# Patient Record
Sex: Male | Born: 1950
Health system: Southern US, Community
[De-identification: ages and names within clinical notes are randomized; demographics above are authoritative.]

## PROBLEM LIST (undated history)

## (undated) DIAGNOSIS — R05 Cough: Secondary | ICD-10-CM

## (undated) DIAGNOSIS — R739 Hyperglycemia, unspecified: Secondary | ICD-10-CM

## (undated) DIAGNOSIS — J310 Chronic rhinitis: Secondary | ICD-10-CM

## (undated) DIAGNOSIS — J45909 Unspecified asthma, uncomplicated: Secondary | ICD-10-CM

## (undated) DIAGNOSIS — R17 Unspecified jaundice: Secondary | ICD-10-CM

## (undated) DIAGNOSIS — J301 Allergic rhinitis due to pollen: Secondary | ICD-10-CM

## (undated) DIAGNOSIS — J019 Acute sinusitis, unspecified: Secondary | ICD-10-CM

## (undated) DIAGNOSIS — E785 Hyperlipidemia, unspecified: Secondary | ICD-10-CM

## (undated) DIAGNOSIS — I10 Essential (primary) hypertension: Secondary | ICD-10-CM

## (undated) HISTORY — DX: Hyperglycemia, unspecified: R73.9

## (undated) HISTORY — DX: Allergic rhinitis due to pollen: J30.1

## (undated) HISTORY — DX: Unspecified asthma, uncomplicated: J45.909

## (undated) HISTORY — PX: TONSILLECTOMY: SUR1361

## (undated) HISTORY — DX: Essential (primary) hypertension: I10

## (undated) HISTORY — PX: FINGER GANGLION CYST EXCISION: SHX1636

## (undated) HISTORY — DX: Acute sinusitis, unspecified: J01.90

## (undated) HISTORY — DX: Chronic rhinitis: J31.0

## (undated) HISTORY — DX: Cough: R05

## (undated) HISTORY — DX: Unspecified jaundice: R17

## (undated) HISTORY — DX: Hyperlipidemia, unspecified: E78.5

---

## 2001-11-13 ENCOUNTER — Emergency Department (HOSPITAL_COMMUNITY): Admission: EM | Admit: 2001-11-13 | Discharge: 2001-11-13 | Payer: Self-pay | Admitting: Emergency Medicine

## 2002-01-12 HISTORY — PX: OTHER SURGICAL HISTORY: SHX169

## 2005-04-13 ENCOUNTER — Ambulatory Visit: Payer: Self-pay | Admitting: Internal Medicine

## 2006-01-15 ENCOUNTER — Ambulatory Visit: Payer: Self-pay | Admitting: Internal Medicine

## 2006-06-29 ENCOUNTER — Ambulatory Visit: Payer: Self-pay | Admitting: Internal Medicine

## 2006-10-22 ENCOUNTER — Encounter: Payer: Self-pay | Admitting: *Deleted

## 2006-10-22 DIAGNOSIS — R17 Unspecified jaundice: Secondary | ICD-10-CM

## 2006-10-22 DIAGNOSIS — J301 Allergic rhinitis due to pollen: Secondary | ICD-10-CM | POA: Insufficient documentation

## 2006-10-22 HISTORY — DX: Allergic rhinitis due to pollen: J30.1

## 2006-10-22 HISTORY — DX: Unspecified jaundice: R17

## 2007-12-01 ENCOUNTER — Ambulatory Visit: Payer: Self-pay | Admitting: Internal Medicine

## 2007-12-01 DIAGNOSIS — E785 Hyperlipidemia, unspecified: Secondary | ICD-10-CM | POA: Insufficient documentation

## 2007-12-01 DIAGNOSIS — J019 Acute sinusitis, unspecified: Secondary | ICD-10-CM | POA: Insufficient documentation

## 2007-12-01 HISTORY — DX: Acute sinusitis, unspecified: J01.90

## 2007-12-01 HISTORY — DX: Hyperlipidemia, unspecified: E78.5

## 2008-02-23 ENCOUNTER — Ambulatory Visit: Payer: Self-pay | Admitting: Internal Medicine

## 2008-02-23 LAB — CONVERTED CEMR LAB
AST: 38 units/L — ABNORMAL HIGH (ref 0–37)
Albumin: 4.3 g/dL (ref 3.5–5.2)
Alkaline Phosphatase: 50 units/L (ref 39–117)
Basophils Relative: 0.4 % (ref 0.0–3.0)
Bilirubin Urine: NEGATIVE
Bilirubin, Direct: 0.1 mg/dL (ref 0.0–0.3)
Calcium: 9.5 mg/dL (ref 8.4–10.5)
Chloride: 104 meq/L (ref 96–112)
Creatinine, Ser: 0.9 mg/dL (ref 0.4–1.5)
Direct LDL: 159.2 mg/dL
Eosinophils Relative: 7.2 % — ABNORMAL HIGH (ref 0.0–5.0)
Leukocytes, UA: NEGATIVE
MCV: 86.8 fL (ref 78.0–100.0)
Nitrite: NEGATIVE
PSA: 1.01 ng/mL (ref 0.10–4.00)
Platelets: 210 10*3/uL (ref 150–400)
RDW: 12.3 % (ref 11.5–14.6)
Specific Gravity, Urine: 1.015 (ref 1.000–1.03)
Total Bilirubin: 1.2 mg/dL (ref 0.3–1.2)
Total CHOL/HDL Ratio: 6
Total Protein, Urine: NEGATIVE mg/dL
Triglycerides: 88 mg/dL (ref 0–149)
Urine Glucose: NEGATIVE mg/dL
WBC: 7.5 10*3/uL (ref 4.5–10.5)

## 2008-03-06 ENCOUNTER — Ambulatory Visit: Payer: Self-pay | Admitting: Internal Medicine

## 2008-03-06 DIAGNOSIS — I10 Essential (primary) hypertension: Secondary | ICD-10-CM

## 2008-03-06 HISTORY — DX: Essential (primary) hypertension: I10

## 2008-06-29 ENCOUNTER — Telehealth: Payer: Self-pay | Admitting: Internal Medicine

## 2008-08-01 ENCOUNTER — Telehealth: Payer: Self-pay | Admitting: Internal Medicine

## 2008-10-05 ENCOUNTER — Telehealth: Payer: Self-pay | Admitting: Internal Medicine

## 2008-10-05 DIAGNOSIS — J4540 Moderate persistent asthma, uncomplicated: Secondary | ICD-10-CM | POA: Insufficient documentation

## 2008-10-05 DIAGNOSIS — J4520 Mild intermittent asthma, uncomplicated: Secondary | ICD-10-CM

## 2008-10-05 DIAGNOSIS — IMO0001 Reserved for inherently not codable concepts without codable children: Secondary | ICD-10-CM | POA: Insufficient documentation

## 2008-10-05 DIAGNOSIS — J45909 Unspecified asthma, uncomplicated: Secondary | ICD-10-CM

## 2008-10-05 HISTORY — DX: Unspecified asthma, uncomplicated: J45.909

## 2008-11-15 ENCOUNTER — Ambulatory Visit: Payer: Self-pay | Admitting: Internal Medicine

## 2008-11-15 DIAGNOSIS — R059 Cough, unspecified: Secondary | ICD-10-CM

## 2008-11-15 DIAGNOSIS — R05 Cough: Secondary | ICD-10-CM

## 2008-11-15 DIAGNOSIS — J310 Chronic rhinitis: Secondary | ICD-10-CM | POA: Insufficient documentation

## 2008-11-15 HISTORY — DX: Chronic rhinitis: J31.0

## 2008-11-15 HISTORY — DX: Cough, unspecified: R05.9

## 2008-11-28 LAB — CONVERTED CEMR LAB: IgE (Immunoglobulin E), Serum: 53.7 intl units/mL (ref 0.0–180.0)

## 2008-12-25 ENCOUNTER — Ambulatory Visit: Payer: Self-pay | Admitting: Internal Medicine

## 2009-01-30 ENCOUNTER — Ambulatory Visit: Payer: Self-pay | Admitting: Internal Medicine

## 2009-01-31 ENCOUNTER — Telehealth (INDEPENDENT_AMBULATORY_CARE_PROVIDER_SITE_OTHER): Payer: Self-pay | Admitting: *Deleted

## 2009-03-19 ENCOUNTER — Ambulatory Visit: Payer: Self-pay | Admitting: Internal Medicine

## 2009-04-03 ENCOUNTER — Telehealth: Payer: Self-pay | Admitting: Internal Medicine

## 2009-04-10 ENCOUNTER — Encounter: Payer: Self-pay | Admitting: Internal Medicine

## 2009-06-24 ENCOUNTER — Encounter: Payer: Self-pay | Admitting: Internal Medicine

## 2009-10-02 ENCOUNTER — Encounter: Payer: Self-pay | Admitting: Internal Medicine

## 2009-10-23 ENCOUNTER — Encounter: Payer: Self-pay | Admitting: Internal Medicine

## 2010-02-11 NOTE — Progress Notes (Signed)
Summary: samples  Phone Note Call from Patient Call back at [phone]   Caller: Spouse tony Call For: Tzippy Testerman Summary of Call: pt would like  samples of benicar 20/12.5 Initial call taken by: Rickard Patience,  April 03, 2009 10:55 AM  Follow-up for Phone Call        Dr Maple Hudson, you gave pt rx for this med at last ov on 3/8.  Is it okay with you if we give pt samples of this? Last visit states that pt's PCP will need to manage BP meds.  Please advise thanks Vernie Murders  April 03, 2009 11:01 AM   Additional Follow-up for Phone Call Additional follow up Details #1::        He was given script last visit and i had emphasized that BP management going forward would need to be through his primary- Dr Jonny Ruiz. please direct him there to ask samples or changess. Additional Follow-up by: Waymon Budge MD,  April 03, 2009 1:26 PM    Additional Follow-up for Phone Call Additional follow up Details #2::    pt wife advised to contact Dr. Jonny Ruiz. Carron Curie CMA  April 03, 2009 2:17 PM

## 2010-02-11 NOTE — Medication Information (Signed)
Summary: Approved/[employer]  Approved/[employer]   Imported By: Lester [place] 04/19/2009 10:22:40  _____________________________________________________________________  External Attachment:    Type:   Image     Comment:   External Document

## 2010-02-11 NOTE — Letter (Signed)
Summary: [facility]  [facility]   Imported By: Lennie Odor 11/05/2009 14:35:04  _____________________________________________________________________  External Attachment:    Type:   Image     Comment:   External Document

## 2010-02-11 NOTE — Letter (Signed)
Summary: Dermatology/[facility]  Dermatology/[facility]   Imported By: Lester Remer 11/11/2009 07:19:32  _____________________________________________________________________  External Attachment:    Type:   Image     Comment:   External Document

## 2010-02-11 NOTE — Assessment & Plan Note (Signed)
Summary: rov 3 months///kp   Copy to:  Dr Jonny Ruiz Primary Provider/Referring Provider:  Dr Oliver Barre   History of Present Illness: History of Present Illness: November 15, 2008-  58yoM seen at kind request of Dr Jonny Ruiz for concern of cough. He describes new onset of dry cough x 2-3 months. It was better in summer, getting worse as weather cools. Wheeze at night, perennial morning nasal congestion.  He used a metered inhaler last winter, challenging his 2-3 month estimate. Refers to hx of allergy/ sinus trouble mostly associated with irritant exposures- truck exhaust, cigarette smoke, hairspray. Home has mildew on bedroom walls he has been meaning to correct. He denies symptoms around his dog or when mowing grass.  December 25, 2008- Cough................................Marland Kitchenwife here Wife doesn't want [place] to tell him if he has emphysema. Minimal cough residual, but much better after BP change- We gave samples Benicar 20/12.5.Marland Kitchen PFT- reversible obstruction only in small airways.   March 19, 2009- Cough Cough is gone- he credits change from ACEI. Doing Qvar only one puff in AM. We reviewed his meds in detail he is beginning to note his typical seasonal rhinorhea and congestion. Has previously used Zyrtec and several other meds.  Current Medications (verified): 1)  Fluticasone Propionate 50 Mcg/act Susp (Fluticasone Propionate) .... 2 Spray/side Once Daily 2)  Proair Hfa 108 (90 Base) Mcg/act Aers (Albuterol Sulfate) .... 2 Puffs As Needed 3)  Benicar Hct 20-12.5 Mg Tabs (Olmesartan Medoxomil-Hctz) .Marland Kitchen.. 1 Daily For Bp 4)  Qvar 40 Mcg/act Aers (Beclomethasone Dipropionate) .... 2 Puffs and Rinse Two Times A Day  Allergies (verified): 1)  ! 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    Vital Signs:  Patient profile:   60 year old male Height:      69 inches Weight:      239.25 pounds BMI:     35.46 O2 Sat:      97 % on Room air Pulse rate:   73 / minute BP sitting:   128 / 80  (right arm) Cuff size:   regular  Vitals Entered By: Reynaldo Minium CMA (March 19, 2009 9:31 AM)  O2 Flow:  Room air  Physical Exam  Additional Exam:  General: A/Ox3; pleasant and cooperative, NAD, stocky, talkative SKIN: no rash, lesions NODES: no lymphadenopathy HEENT: [place]/AT, EOM- WNL, Conjuctivae- clear, PERRLA, TM-WNL, Nose- shiny turbinate edema, Throat- clear and wnl, Melampatti III NECK: Supple w/ fair ROM, JVD- none, normal carotid impulses w/o bruits Thyroid- n CHEST:Mild scattered wheeze without cough, left  mre than right side, unlabored. HEART: RRR, no m/g/r heard ABDOMEN: Soft and nl; DGU:YQIH, nl pulses, no edema  NEURO: Grossly intact to observation      Impression & Recommendations:  Problem # 1:  CHRONIC RHINITIS (ICD-472.0)  Significant seasonal rhintis. I explained nasal steroids and  compared with antihistamines.  Problem # 2:  ASTHMA, UNSPECIFIED, UNSPECIFIED STATUS (ICD-493.90) Cough was largely due to ACEI but he does have some wheeze. I favor continuing the Qvar at least through this season.  Other Orders: Est. Patient Level III (16109)  Patient Instructions: 1)  Please schedule a  follow-up appointment in 6 months- Fall season. Earlier if needed. 2)  Consider an otc nonsedating antihistamine if needed for intermittent or daily use for sneezing and nasal drip: allegra/ fexofenadine, or claritin/ loratadine. 3)  Flonase/ fluticasone nasal spray and Qvar 40 are maintenance steroids designed for regular daily use at least during times of need. 4)  Scripts refilled. You will want to have your primary doctor manage your BP long term. Prescriptions: BENICAR HCT 20-12.5 MG TABS (OLMESARTAN MEDOXOMIL-HCTZ) 1 daily for BP  #90 x 3   Entered and Authorized by:   Waymon Budge MD   Signed by:   Waymon Budge MD on 03/19/2009   Method used:   Print then Give to Patient   RxID:   [id number] QVAR 40 MCG/ACT AERS (BECLOMETHASONE DIPROPIONATE) 2 puffs and rinse two times a day  #3 x 3   Entered and Authorized by:   Waymon Budge MD   Signed by:   Waymon Budge MD on 03/19/2009   Method used:   Print then Give to Patient   RxID:   [id number] PROAIR HFA 108 (90 BASE) MCG/ACT AERS (ALBUTEROL SULFATE) 2 puffs as needed  #3 x 3   Entered and Authorized by:   Waymon Budge MD   Signed by:   Waymon Budge MD on 03/19/2009   Method used:   Print then Give to Patient   RxID:   [id number] FLUTICASONE PROPIONATE 50 MCG/ACT SUSP (FLUTICASONE PROPIONATE) 2 spray/side once daily  #3 x 3   Entered and Authorized by:   Waymon Budge MD   Signed by:   Waymon Budge MD on 03/19/2009   Method used:   Print then Give to Patient   RxID:   [id number]

## 2010-02-11 NOTE — Progress Notes (Signed)
Summary: Record request from [facility] [place], [place].  Request for records received from [facility] [place], [place]. Request forwarded to [employer]. Wilder Glade  January 31, 2009 4:24 PM

## 2010-02-11 NOTE — Letter (Signed)
Summary: [facility]   Imported By: Lester Rosenberg 06/27/2009 09:11:59  _____________________________________________________________________  External Attachment:    Type:   Image     Comment:   External Document

## 2010-03-31 ENCOUNTER — Telehealth (INDEPENDENT_AMBULATORY_CARE_PROVIDER_SITE_OTHER): Payer: Self-pay | Admitting: *Deleted

## 2010-04-10 NOTE — Progress Notes (Signed)
Summary: would like to be seen sooner than 05/16/10 for allergies  Phone Note Call from Patient   Caller: SPOuse//SH Call For: YOUNG Summary of Call: patients wife Vincent Mckay phoned Vincent Mckay is a patient of Dr Sinclair Ship for pulmonary but has bad allergies and wanted an appointment with Dr Maple Hudson for allergies. The first available is isnt until 5/4 they took that appt but would like to be worked in sooner if possible. Vincent Mckay can be reached at [phone] or on cell [phone] Initial call taken by: Vedia Coffer,  March 31, 2010 2:07 PM  Follow-up for Phone Call        [facility] please advise it you can get pt in before the may appt for his allergies.  thanks Randell Loop CMA  March 31, 2010 3:37 PM   Additional Follow-up for Phone Call Additional follow up Details #1::        CDY, do you want to have pt come in for allergy consult or you want a ROV with you and add the allergy issues in with his pulmonary issues. Thanks.Reynaldo Minium CMA  March 31, 2010 4:24 PM     Additional Follow-up for Phone Call Additional follow up Details #2::    Per CDY-he will see pt for regular OV and discuss allergy issues as well at this time-please have pt come in on 05-07-10 at 11am for 1115am appt.Reynaldo Minium CMA  March 31, 2010 4:48 PM   Called and spoke with pt wife and informed her of the work in date and she states pt will just keep apt ascheduled for 05/16/10  Carver Fila  March 31, 2010 5:03 PM

## 2010-05-13 ENCOUNTER — Encounter: Payer: Self-pay | Admitting: Internal Medicine

## 2010-05-13 DIAGNOSIS — Z0001 Encounter for general adult medical examination with abnormal findings: Secondary | ICD-10-CM | POA: Insufficient documentation

## 2010-05-13 DIAGNOSIS — Z0000 Encounter for general adult medical examination without abnormal findings: Secondary | ICD-10-CM | POA: Insufficient documentation

## 2010-05-14 ENCOUNTER — Encounter: Payer: Self-pay | Admitting: Internal Medicine

## 2010-05-16 ENCOUNTER — Ambulatory Visit (INDEPENDENT_AMBULATORY_CARE_PROVIDER_SITE_OTHER): Payer: BC Managed Care – PPO | Admitting: Internal Medicine

## 2010-05-16 ENCOUNTER — Other Ambulatory Visit (INDEPENDENT_AMBULATORY_CARE_PROVIDER_SITE_OTHER): Payer: BC Managed Care – PPO

## 2010-05-16 ENCOUNTER — Encounter: Payer: Self-pay | Admitting: Internal Medicine

## 2010-05-16 ENCOUNTER — Other Ambulatory Visit (INDEPENDENT_AMBULATORY_CARE_PROVIDER_SITE_OTHER): Payer: BC Managed Care – PPO | Admitting: Internal Medicine

## 2010-05-16 VITALS — BP 136/68 | HR 62 | Temp 98.6°F | Ht 70.0 in | Wt 245.4 lb

## 2010-05-16 VITALS — BP 130/78 | HR 63 | Ht 69.0 in | Wt 246.4 lb

## 2010-05-16 DIAGNOSIS — Z13220 Encounter for screening for lipoid disorders: Secondary | ICD-10-CM

## 2010-05-16 DIAGNOSIS — Z0000 Encounter for general adult medical examination without abnormal findings: Secondary | ICD-10-CM

## 2010-05-16 DIAGNOSIS — J310 Chronic rhinitis: Secondary | ICD-10-CM

## 2010-05-16 DIAGNOSIS — J45909 Unspecified asthma, uncomplicated: Secondary | ICD-10-CM

## 2010-05-16 LAB — CBC WITH DIFFERENTIAL/PLATELET
Basophils Absolute: 0 10*3/uL (ref 0.0–0.1)
Eosinophils Relative: 8.1 % — ABNORMAL HIGH (ref 0.0–5.0)
HCT: 42.5 % (ref 39.0–52.0)
Lymphs Abs: 1.5 10*3/uL (ref 0.7–4.0)
Monocytes Absolute: 0.9 10*3/uL (ref 0.1–1.0)
Monocytes Relative: 11.7 % (ref 3.0–12.0)
Neutrophils Relative %: 59.9 % (ref 43.0–77.0)
Platelets: 227 10*3/uL (ref 150.0–400.0)
RDW: 13 % (ref 11.5–14.6)
WBC: 7.5 10*3/uL (ref 4.5–10.5)

## 2010-05-16 LAB — URINALYSIS, ROUTINE W REFLEX MICROSCOPIC
Hgb urine dipstick: NEGATIVE
Nitrite: NEGATIVE
Specific Gravity, Urine: >=1.03 (ref 1.000–1.030)
Total Protein, Urine: NEGATIVE
Urine Glucose: NEGATIVE
pH: 5.5 (ref 5.0–8.0)

## 2010-05-16 LAB — BASIC METABOLIC PANEL
BUN: 19 mg/dL (ref 6–23)
CO2: 27 mEq/L (ref 19–32)
Glucose, Bld: 96 mg/dL (ref 70–99)
Potassium: 4.4 mEq/L (ref 3.5–5.1)
Sodium: 138 mEq/L (ref 135–145)

## 2010-05-16 LAB — HEPATIC FUNCTION PANEL
ALT: 29 U/L (ref 0–53)
AST: 29 U/L (ref 0–37)
Bilirubin, Direct: 0 mg/dL (ref 0.0–0.3)
Total Bilirubin: 0.7 mg/dL (ref 0.3–1.2)

## 2010-05-16 LAB — TSH: TSH: 1.4 u[IU]/mL (ref 0.35–5.50)

## 2010-05-16 LAB — LIPID PANEL
HDL: 40.7 mg/dL (ref >39.00)
VLDL: 23 mg/dL (ref 0.0–40.0)

## 2010-05-16 LAB — LDL CHOLESTEROL, DIRECT: Direct LDL: 161.8 mg/dL

## 2010-05-16 MED ORDER — BECLOMETHASONE DIPROPIONATE 40 MCG/ACT IN AERS: 2.0000 | INHALATION_SPRAY | Freq: Two times a day (BID) | RESPIRATORY_TRACT | Status: DC

## 2010-05-16 MED ORDER — ALBUTEROL SULFATE HFA 108 (90 BASE) MCG/ACT IN AERS: 2.0000 | INHALATION_SPRAY | Freq: Four times a day (QID) | RESPIRATORY_TRACT | Status: DC | PRN

## 2010-05-16 MED ORDER — OLMESARTAN MEDOXOMIL-HCTZ 20-12.5 MG PO TABS: 1.0000 | ORAL_TABLET | Freq: Every day | ORAL | Status: DC

## 2010-05-16 MED ORDER — FLUTICASONE PROPIONATE 50 MCG/ACT NA SUSP: NASAL | Status: DC

## 2010-05-16 NOTE — Progress Notes (Signed)
  Subjective:    Patient ID: Vincent Mckay, male    DOB: Jan 09, 1951, 60 y.o.   MRN: [id number]  HPI 58 yoM with hx asthma, now asking allergy evaluation. I had seen him before in 2010 for different problem- ACEI cough.  Now asks help for watery nose and itching eyes. Blows watery rhinorhea. For this has been taking up to 6 loratadine/ day. If he starts loratadine early he may head off a flare. Spring season is worst. This year his sympoms peaked for 10 days in mid-March. He is less bad than that currently. No defined asthma, but some chest tightness which is better on Qvar/ Proair.  Allergy profile 11/15/08- IgE 53.7; broadly elevated. Review of Systems Constitutional:   No weight loss, night sweats,  Fevers, chills, fatigue, lassitude. HEENT:   No headaches,  Difficulty swallowing,  Tooth/dental problems,  Sore throat,               CV:  No chest pain,  Orthopnea, PND, swelling in lower extremities, anasarca, dizziness, palpitations  GI  No heartburn, indigestion, abdominal pain, nausea, vomiting, diarrhea, change in bowel habits, loss of appetite  Resp: No shortness of breath with exertion or at rest.  No excess mucus, no productive cough,  No non-productive cough,  No coughing up of blood.  No change in color of mucus.  No wheezing.  No chest wall deformity  Skin: no rash or lesions.  GU: no dysuria, change in color of urine, no urgency or frequency.  No flank pain.  MS:  No joint pain or swelling.  No decreased range of motion.  No back pain.  Psych:  No change in mood or affect. No depression or anxiety.  No memory loss.     Objective:   Physical Exam General- Alert, Oriented, Affect-appropriate, Distress- none acute  Skin- rash-none, lesions- none, excoriation- none  Lymphadenopathy- none  Head- atraumatic  Eyes- Gross vision intact, PERRLA, conjunctivae clear, secretions  Ears- Normal- Hearing, canals, Tm Nose- Clear, No-  Septal dev, polyps, erosion, perforation     Increased white mucus  Throat- Mallampati II , mucosa clear , drainage- none, tonsils- atrophic  Neck- flexible , trachea midline, no stridor , thyroid nl, carotid no bruit  Chest - symmetrical excursion , unlabored     Heart/CV- RRR , no murmur , no gallop  , no rub, nl s1 s2                     - JVD- none , edema- none, stasis changes- none, varices- none     Lung- clear to P&A, wheeze- none, cough- none , dullness-none, rub- none     Chest wall-   Abd- tender-no, distended-no, bowel sounds-present, HSM- no  Br/ Gen/ Rectal- Not done, not indicated  Extrem- cyanosis- none, clubbing, none, atrophy- none, strength- nl  Neuro- grossly intact to observation         Assessment & Plan:

## 2010-05-16 NOTE — Patient Instructions (Addendum)
You will be contacted regarding the referral for: colonoscopy Continue all other medications as before Please go to LAB in the Basement for the blood and/or urine tests to be done today Please call the number on the Blue Card (the [employer] System) for results of testing in 2-3 days Please return in 1 year for your yearly visit, or sooner if needed, with Lab testing done 3-5 days before

## 2010-05-16 NOTE — Patient Instructions (Signed)
Suggest I see you shortly before you might start having trouble next Spring. We can consider treatments to get you through the Spring.

## 2010-05-16 NOTE — Assessment & Plan Note (Signed)
Overall doing well, age appropriate education and counseling updated, referrals for preventative services and immunizations addressed, dietary and smoking counseling addressed, most recent labs and ECG reviewed.  I have personally reviewed and have noted: 1) the patient's medical and social history 2) The pt's use of alcohol, tobacco, and illicit drugs 3) The patient's current medications and supplements 4) Functional ability including ADL's, fall risk, home safety risk, hearing and visual impairment 5) Diet and physical activities 6) Evidence for depression or mood disorder 7) The patient's height, weight, and BMI have been recorded in the chart I have made referrals, and provided counseling and education based on review of the above Declines tetanus today, but will allow referral to colonscopy

## 2010-05-17 ENCOUNTER — Encounter: Payer: Self-pay | Admitting: Internal Medicine

## 2010-05-17 NOTE — Progress Notes (Signed)
Subjective:    Patient ID: Vincent Mckay, male    DOB: 27-Dec-1950, 60 y.o.   MRN: [id number]  HPI  Here for wellness and f/u;  Overall doing ok;  Pt denies CP, worsening SOB, DOE, wheezing, orthopnea, PND, worsening LE edema, palpitations, dizziness or syncope.  Pt denies neurological change such as new Headache, facial or extremity weakness.  Pt denies polydipsia, polyuria, or low sugar symptoms. Pt states overall good compliance with treatment and medications, good tolerability, and trying to follow lower cholesterol diet.  Pt denies worsening depressive symptoms, suicidal ideation or panic. No fever, wt loss, night sweats, loss of appetite, or other constitutional symptoms.  Pt states good ability with ADL's, low fall risk, home safety reviewed and adequate, no significant changes in hearing or vision, and occasionally active with exercise.  No other acute complaints Past Medical History  Diagnosis Date  . HYPERLIPIDEMIA 12/01/2007  . HYPERTENSION 03/06/2008  . SINUSITIS- ACUTE-NOS 12/01/2007  . Chronic rhinitis 11/15/2008  . HAY FEVER 10/22/2006  . ASTHMA, UNSPECIFIED, UNSPECIFIED STATUS 10/05/2008  . JAUNDICE 10/22/2006  . Cough 11/15/2008   Past Surgical History  Procedure Date  . S/p left bicep tendon rupture 2004  . Tonsillectomy     reports that he has never smoked. He does not have any smokeless tobacco history on file. He reports that he does not drink alcohol. His drug history not on file. family history includes Arthritis in his other and Diabetes in his other. Allergies  Allergen Reactions  . Ace Inhibitors     REACTION: Cough   Current Outpatient Prescriptions on File Prior to Visit  Medication Sig Dispense Refill  . albuterol (PROAIR HFA) 108 (90 BASE) MCG/ACT inhaler Inhale 2 puffs into the lungs every 6 (six) hours as needed for wheezing or shortness of breath.  3 Inhaler  3  . beclomethasone (QVAR) 40 MCG/ACT inhaler Inhale 2 puffs into the lungs 2 (two) times daily. Rinse  two times a day  360 Act  3  . fluticasone (FLONASE) 50 MCG/ACT nasal spray 2 sprays each nostril once daily  48 g  3  ' Review of Systems Review of Systems  Constitutional: Negative for diaphoresis, activity change, appetite change and unexpected weight change.  HENT: Negative for hearing loss, ear pain, facial swelling, mouth sores and neck stiffness.   Eyes: Negative for pain, redness and visual disturbance.  Respiratory: Negative for shortness of breath and wheezing.   Cardiovascular: Negative for chest pain and palpitations.  Gastrointestinal: Negative for diarrhea, blood in stool, abdominal distention and rectal pain.  Genitourinary: Negative for hematuria, flank pain and decreased urine volume.  Musculoskeletal: Negative for myalgias and joint swelling.  Skin: Negative for color change and wound.  Neurological: Negative for syncope and numbness.  Hematological: Negative for adenopathy.  Psychiatric/Behavioral: Negative for hallucinations, self-injury, decreased concentration and agitation.      Objective:   Physical Exam BP 136/68  Pulse 62  Temp(Src) 98.6 F (37 C) (Oral)  Ht 5\' 10"  (1.778 m)  Wt 245 lb 6 oz (111.301 kg)  BMI 35.21 kg/m2  SpO2 96% Physical Exam  VS noted Constitutional: Pt is oriented to person, place, and time. Appears well-developed and well-nourished.  HENT:  Head: Normocephalic and atraumatic.  Right Ear: External ear normal.  Left Ear: External ear normal.  Nose: Nose normal.  Mouth/Throat: Oropharynx is clear and moist.  Eyes: Conjunctivae and EOM are normal. Pupils are equal, round, and reactive to light.  Neck: Normal range of  motion. Neck supple. No JVD present. No tracheal deviation present.  Cardiovascular: Normal rate, regular rhythm, normal heart sounds and intact distal pulses.   Pulmonary/Chest: Effort normal and breath sounds normal.  Abdominal: Soft. Bowel sounds are normal. There is no tenderness.  Musculoskeletal: Normal range of  motion. Exhibits no edema.  Lymphadenopathy:  Has no cervical adenopathy.  Neurological: Pt is alert and oriented to person, place, and time. Pt has normal reflexes. No cranial nerve deficit.  Skin: Skin is warm and dry. No rash noted.  Psychiatric:  Has  normal mood and affect. Behavior is normal.         Assessment & Plan:

## 2010-05-18 ENCOUNTER — Telehealth: Payer: Self-pay | Admitting: Internal Medicine

## 2010-05-18 MED ORDER — ATORVASTATIN CALCIUM 10 MG PO TABS: 10.0000 mg | ORAL_TABLET | Freq: Every day | ORAL | Status: DC

## 2010-05-18 NOTE — Telephone Encounter (Signed)
.  Left message on phone tree  - LDL chol very high       1)  lipitor 10 mg daily      2)  Return for lab only 4 wks:    Lipids 272.0                                                        Hepatic function panel  V58.69  Robin to inform pt, and arrange labs

## 2010-05-19 NOTE — Telephone Encounter (Signed)
Called patient; left message to call back.

## 2010-05-20 ENCOUNTER — Encounter: Payer: Self-pay | Admitting: Internal Medicine

## 2010-05-20 NOTE — Telephone Encounter (Signed)
Called patient informed of lab results and scheduled patient to return to the lab the first week of June.

## 2010-05-21 NOTE — Assessment & Plan Note (Signed)
Atopy is probable. We began with a discussion of environmental precautions- dust mite/ pollen.We can evaluate further as needed. This seems likely to be short and limited mostly to Spring season. We discussed short term intervention once or twice per year, including depo, and meds to dry watery nose, such as ipratropium or doxepin.

## 2010-05-21 NOTE — Assessment & Plan Note (Signed)
Minimal intermittent and mostly subclinical asthma. His current inhalers should be sufficient as reviewed with him.

## 2010-06-19 ENCOUNTER — Other Ambulatory Visit: Payer: Self-pay | Admitting: Internal Medicine

## 2010-06-19 ENCOUNTER — Other Ambulatory Visit: Payer: BC Managed Care – PPO

## 2010-06-19 DIAGNOSIS — E7800 Pure hypercholesterolemia, unspecified: Secondary | ICD-10-CM

## 2010-06-19 DIAGNOSIS — Z79899 Other long term (current) drug therapy: Secondary | ICD-10-CM

## 2010-07-22 ENCOUNTER — Encounter: Payer: Self-pay | Admitting: Gastroenterology

## 2010-10-09 ENCOUNTER — Encounter: Payer: Self-pay | Admitting: *Deleted

## 2010-11-14 ENCOUNTER — Ambulatory Visit: Payer: BC Managed Care – PPO | Admitting: Internal Medicine

## 2010-11-20 ENCOUNTER — Encounter: Payer: Self-pay | Admitting: Internal Medicine

## 2010-12-01 ENCOUNTER — Ambulatory Visit (AMBULATORY_SURGERY_CENTER): Payer: BC Managed Care – PPO | Admitting: *Deleted

## 2010-12-01 VITALS — Ht 70.0 in | Wt 245.0 lb

## 2010-12-01 DIAGNOSIS — Z1211 Encounter for screening for malignant neoplasm of colon: Secondary | ICD-10-CM

## 2010-12-01 MED ORDER — PEG-KCL-NACL-NASULF-NA ASC-C 100 G PO SOLR: ORAL | Status: DC

## 2010-12-12 ENCOUNTER — Ambulatory Visit (AMBULATORY_SURGERY_CENTER): Payer: BC Managed Care – PPO | Admitting: Internal Medicine

## 2010-12-12 ENCOUNTER — Encounter: Payer: Self-pay | Admitting: Internal Medicine

## 2010-12-12 VITALS — HR 77 | Temp 97.2°F | Resp 24 | Ht 70.0 in | Wt 245.0 lb

## 2010-12-12 DIAGNOSIS — D126 Benign neoplasm of colon, unspecified: Secondary | ICD-10-CM

## 2010-12-12 DIAGNOSIS — D128 Benign neoplasm of rectum: Secondary | ICD-10-CM

## 2010-12-12 DIAGNOSIS — Z1211 Encounter for screening for malignant neoplasm of colon: Secondary | ICD-10-CM

## 2010-12-12 DIAGNOSIS — D1330 Benign neoplasm of unspecified part of small intestine: Secondary | ICD-10-CM

## 2010-12-12 DIAGNOSIS — D129 Benign neoplasm of anus and anal canal: Secondary | ICD-10-CM

## 2010-12-12 MED ORDER — SODIUM CHLORIDE 0.9 % IV SOLN: 500.0000 mL | INTRAVENOUS | Status: DC

## 2010-12-12 NOTE — Patient Instructions (Signed)
FOLLOW DISCHARGE INSTRUCTIONS (BLUE & GREEN SHEETS).   Information on polyps, diverticulosis, hemorrhoids , & high fiber diet given to you

## 2010-12-12 NOTE — Progress Notes (Signed)
Patient did not experience any of the following events: a burn prior to discharge; a fall within the facility; wrong site/side/patient/procedure/implant event; or a hospital transfer or hospital admission upon discharge from the facility. (G8907) Patient did not have preoperative order for IV antibiotic SSI prophylaxis. (G8918)  

## 2010-12-12 NOTE — Progress Notes (Signed)
Pt tolerated the colonoscopy very well. maw 

## 2010-12-15 ENCOUNTER — Telehealth: Payer: Self-pay

## 2010-12-15 NOTE — Telephone Encounter (Signed)

## 2010-12-16 ENCOUNTER — Encounter: Payer: Self-pay | Admitting: Internal Medicine

## 2011-01-16 ENCOUNTER — Ambulatory Visit: Payer: BC Managed Care – PPO | Admitting: Internal Medicine

## 2011-01-19 ENCOUNTER — Other Ambulatory Visit: Payer: Self-pay

## 2011-01-19 MED ORDER — OLMESARTAN MEDOXOMIL-HCTZ 20-12.5 MG PO TABS: 1.0000 | ORAL_TABLET | Freq: Every day | ORAL | Status: DC

## 2011-08-05 ENCOUNTER — Telehealth: Payer: Self-pay | Admitting: Internal Medicine

## 2011-08-05 DIAGNOSIS — K645 Perianal venous thrombosis: Secondary | ICD-10-CM

## 2011-08-05 DIAGNOSIS — K6289 Other specified diseases of anus and rectum: Secondary | ICD-10-CM

## 2011-08-05 NOTE — Telephone Encounter (Signed)
Wife reports pt went to the ER in [facility] over the weekend for rectal pain and a thrombosed hemorrhoid was found, wife reports they put a "foam" on the area and he was better Sat. And Sunday.  The pain returned last night. In the past pt has seen surgeons and the hemorrhoid was lanced. She states the previous meds have not worked and pt just saw a Careers adviser. I asked about sitz baths and she states he has never used one. I asked about which surgeon pt saw and she states the surgeons in [facility] have split up and they don't have one now. Any meds? Please advise. Thanks.

## 2011-08-05 NOTE — Telephone Encounter (Signed)
Patient had thrombosed hemorrhoid on Saturday and went to [facility] ER.  Wife states he seemed better Sunday but she states he is back in pain and when looking at the hemorrhoid it does not look like it has changed any.

## 2011-08-05 NOTE — Telephone Encounter (Signed)
Trial Proctofoam as directed x1 week. Agree with sitz baths. If this continues he may need repeat incision and drainage and we can refer to surgery if necessary

## 2011-08-06 NOTE — Telephone Encounter (Signed)
Informed wife that we can order proctofoam and pt needs to do the sitz baths. If these interventions don't work, he may need a referral to a Careers adviser. Wife stated he's tried these interventions before, the only thing that helps the pt is excision. They would like a referral. Told her I will call her tomorrow. Call her cell [phone]

## 2011-08-06 NOTE — Telephone Encounter (Signed)
lmom for wife to call back. 

## 2011-08-07 ENCOUNTER — Encounter (INDEPENDENT_AMBULATORY_CARE_PROVIDER_SITE_OTHER): Payer: BC Managed Care – PPO | Admitting: General Surgery

## 2011-08-07 NOTE — Telephone Encounter (Signed)
[facility] called pt and he will be seen today.

## 2011-08-19 DIAGNOSIS — Z85828 Personal history of other malignant neoplasm of skin: Secondary | ICD-10-CM | POA: Insufficient documentation

## 2011-08-19 DIAGNOSIS — L84 Corns and callosities: Secondary | ICD-10-CM | POA: Insufficient documentation

## 2011-09-14 ENCOUNTER — Other Ambulatory Visit: Payer: Self-pay | Admitting: Internal Medicine

## 2011-09-21 ENCOUNTER — Ambulatory Visit (INDEPENDENT_AMBULATORY_CARE_PROVIDER_SITE_OTHER): Payer: BC Managed Care – PPO | Admitting: Internal Medicine

## 2011-09-21 ENCOUNTER — Encounter: Payer: Self-pay | Admitting: Internal Medicine

## 2011-09-21 ENCOUNTER — Other Ambulatory Visit: Payer: Self-pay | Admitting: Internal Medicine

## 2011-09-21 ENCOUNTER — Other Ambulatory Visit (INDEPENDENT_AMBULATORY_CARE_PROVIDER_SITE_OTHER): Payer: BC Managed Care – PPO

## 2011-09-21 VITALS — BP 142/82 | HR 66 | Temp 97.8°F | Ht 70.0 in | Wt 244.5 lb

## 2011-09-21 DIAGNOSIS — Z0000 Encounter for general adult medical examination without abnormal findings: Secondary | ICD-10-CM

## 2011-09-21 DIAGNOSIS — Z23 Encounter for immunization: Secondary | ICD-10-CM

## 2011-09-21 LAB — CBC WITH DIFFERENTIAL/PLATELET
Basophils Relative: 0.5 % (ref 0.0–3.0)
Eosinophils Relative: 7.7 % — ABNORMAL HIGH (ref 0.0–5.0)
HCT: 45.1 % (ref 39.0–52.0)
Lymphs Abs: 1 10*3/uL (ref 0.7–4.0)
MCV: 86.8 fl (ref 78.0–100.0)
Monocytes Absolute: 0.8 10*3/uL (ref 0.1–1.0)
Monocytes Relative: 12.4 % — ABNORMAL HIGH (ref 3.0–12.0)
RBC: 5.2 Mil/uL (ref 4.22–5.81)
WBC: 6.5 10*3/uL (ref 4.5–10.5)

## 2011-09-21 LAB — HEPATIC FUNCTION PANEL: Total Bilirubin: 0.5 mg/dL (ref 0.3–1.2)

## 2011-09-21 LAB — BASIC METABOLIC PANEL
BUN: 19 mg/dL (ref 6–23)
Calcium: 9.4 mg/dL (ref 8.4–10.5)
Creatinine, Ser: 0.9 mg/dL (ref 0.4–1.5)
GFR: 93.38 mL/min (ref >60.00)

## 2011-09-21 LAB — URINALYSIS, ROUTINE W REFLEX MICROSCOPIC
Bilirubin Urine: NEGATIVE
Ketones, ur: NEGATIVE
Nitrite: NEGATIVE
Specific Gravity, Urine: 1.015 (ref 1.000–1.030)
Total Protein, Urine: NEGATIVE
pH: 6 (ref 5.0–8.0)

## 2011-09-21 LAB — PSA: PSA: 1.54 ng/mL (ref 0.10–4.00)

## 2011-09-21 LAB — LIPID PANEL: Cholesterol: 195 mg/dL (ref 0–200)

## 2011-09-21 MED ORDER — ASPIRIN 81 MG PO TBEC: 81.0000 mg | DELAYED_RELEASE_TABLET | Freq: Every day | ORAL | Status: AC

## 2011-09-21 MED ORDER — FLUTICASONE PROPIONATE 50 MCG/ACT NA SUSP: NASAL | Status: DC

## 2011-09-21 MED ORDER — ATORVASTATIN CALCIUM 10 MG PO TABS: 10.0000 mg | ORAL_TABLET | Freq: Every day | ORAL | Status: DC

## 2011-09-21 MED ORDER — BECLOMETHASONE DIPROPIONATE 40 MCG/ACT IN AERS: 2.0000 | INHALATION_SPRAY | Freq: Two times a day (BID) | RESPIRATORY_TRACT | Status: DC

## 2011-09-21 MED ORDER — ALBUTEROL SULFATE HFA 108 (90 BASE) MCG/ACT IN AERS: 2.0000 | INHALATION_SPRAY | Freq: Four times a day (QID) | RESPIRATORY_TRACT | Status: DC | PRN

## 2011-09-21 MED ORDER — OLMESARTAN MEDOXOMIL-HCTZ 20-12.5 MG PO TABS: 1.0000 | ORAL_TABLET | Freq: Every day | ORAL | Status: DC

## 2011-09-21 NOTE — Progress Notes (Signed)
Subjective:    Patient ID: Vincent Mckay, male    DOB: 1950/06/24, 61 y.o.   MRN: [id number]  HPI  Here for wellness and f/u;  Overall doing ok;  Pt denies CP, worsening SOB, DOE, wheezing, orthopnea, PND, worsening LE edema, palpitations, dizziness or syncope.  Pt denies neurological change such as new Headache, facial or extremity weakness.  Pt denies polydipsia, polyuria, or low sugar symptoms. Pt states overall good compliance with treatment and medications, good tolerability, and trying to follow lower cholesterol diet.  Pt denies worsening depressive symptoms, suicidal ideation or panic. No fever, wt loss, night sweats, loss of appetite, or other constitutional symptoms.  Pt states good ability with ADL's, low fall risk, home safety reviewed and adequate, no significant changes in hearing or vision, and occasionally active with exercise.  Needs med refills, using red yeast rice instead of lipitor and working on diet, but willing to accept lipitor if needed Past Medical History  Diagnosis Date  . HYPERLIPIDEMIA 12/01/2007  . HYPERTENSION 03/06/2008  . SINUSITIS- ACUTE-NOS 12/01/2007  . Chronic rhinitis 11/15/2008  . HAY FEVER 10/22/2006  . ASTHMA, UNSPECIFIED, UNSPECIFIED STATUS 10/05/2008  . JAUNDICE 10/22/2006  . Cough 11/15/2008   Past Surgical History  Procedure Date  . S/p left bicep tendon rupture 2004  . Tonsillectomy   . Finger ganglion cyst excision     right index    reports that he has never smoked. He has never used smokeless tobacco. He reports that he does not drink alcohol or use illicit drugs. family history includes Arthritis in his other and Diabetes in his other. Allergies  Allergen Reactions  . Ace Inhibitors     REACTION: Cough   Current Outpatient Prescriptions on File Prior to Visit  Medication Sig Dispense Refill  . co-enzyme Q-10 50 MG capsule Take 50 mg by mouth daily.        . Ginkgo 60 MG TABS Take 1 tablet by mouth daily.        . Red Yeast Rice Extract  (RED YEAST RICE PO) Take 1 tablet by mouth daily.        . vitamin C (ASCORBIC ACID) 500 MG tablet Take 500 mg by mouth daily.        Marland Kitchen DISCONTD: BENICAR HCT 20-12.5 MG per tablet TAKE 1 TABLET EVERY DAY  90 tablet  0  . DISCONTD: albuterol (PROAIR HFA) 108 (90 BASE) MCG/ACT inhaler Inhale 2 puffs into the lungs every 6 (six) hours as needed for wheezing or shortness of breath.  3 Inhaler  3  . DISCONTD: atorvastatin (LIPITOR) 10 MG tablet Take 1 tablet (10 mg total) by mouth daily.  90 tablet  3  . DISCONTD: beclomethasone (QVAR) 40 MCG/ACT inhaler Inhale 2 puffs into the lungs 2 (two) times daily. Rinse two times a day  360 Act  3  . DISCONTD: fluticasone (FLONASE) 50 MCG/ACT nasal spray 2 sprays each nostril once daily  48 g  3   Review of Systems Review of Systems  Constitutional: Negative for diaphoresis, activity change, appetite change and unexpected weight change.  HENT: Negative for hearing loss, ear pain, facial swelling, mouth sores and neck stiffness.   Eyes: Negative for pain, redness and visual disturbance.  Respiratory: Negative for shortness of breath and wheezing.   Cardiovascular: Negative for chest pain and palpitations.  Gastrointestinal: Negative for diarrhea, blood in stool, abdominal distention and rectal pain.  Genitourinary: Negative for hematuria, flank pain and decreased urine volume.  Musculoskeletal:  Negative for myalgias and joint swelling.  Skin: Negative for color change and wound.  Neurological: Negative for syncope and numbness.  Hematological: Negative for adenopathy.  Psychiatric/Behavioral: Negative for hallucinations, self-injury, decreased concentration and agitation.      Objective:   Physical Exam BP 142/82  Pulse 66  Temp 97.8 F (36.6 C) (Oral)  Ht 5\' 10"  (1.778 m)  Wt 244 lb 8 oz (110.904 kg)  BMI 35.08 kg/m2  SpO2 97% Physical Exam  VS noted Constitutional: Pt is oriented to person, place, and time. Appears well-developed and  well-nourished.  HENT:  Head: Normocephalic and atraumatic.  Right Ear: External ear normal.  Left Ear: External ear normal.  Nose: Nose normal.  Mouth/Throat: Oropharynx is clear and moist.  Eyes: Conjunctivae and EOM are normal. Pupils are equal, round, and reactive to light.  Neck: Normal range of motion. Neck supple. No JVD present. No tracheal deviation present.  Cardiovascular: Normal rate, regular rhythm, normal heart sounds and intact distal pulses.   Pulmonary/Chest: Effort normal and breath sounds normal.  Abdominal: Soft. Bowel sounds are normal. There is no tenderness.  Musculoskeletal: Normal range of motion. Exhibits no edema.  Lymphadenopathy:  Has no cervical adenopathy.  Neurological: Pt is alert and oriented to person, place, and time. Pt has normal reflexes. No cranial nerve deficit.  Skin: Skin is warm and dry. No rash noted.  Psychiatric:  Has  normal mood and affect. Behavior is normal.     Assessment & Plan:

## 2011-09-21 NOTE — Assessment & Plan Note (Signed)

## 2011-09-21 NOTE — Patient Instructions (Addendum)
Please check your blood pressure on a regular basis;  Your goal is to be less than 140/90 Continue all other medications as before Your refills were done today - and all given hardcopy Please have the pharmacy call with any refills you may need. Please continue your efforts at being more active, low cholesterol diet, and weight control. You had the flu shot today You are otherwise up to date with prevention Please go to LAB in the Basement for the blood and/or urine tests to be done today You will be contacted by phone if any changes need to be made immediately.  Otherwise, you will receive a letter about your results with an explanation. Your EKG was ok today Please return in 1 year for your yearly visit, or sooner if needed, with Lab testing done 3-5 days before

## 2011-12-15 ENCOUNTER — Other Ambulatory Visit: Payer: Self-pay | Admitting: Internal Medicine

## 2012-03-22 ENCOUNTER — Other Ambulatory Visit: Payer: Self-pay

## 2012-03-22 ENCOUNTER — Telehealth: Payer: Self-pay

## 2012-03-22 MED ORDER — OLMESARTAN MEDOXOMIL-HCTZ 20-12.5 MG PO TABS: ORAL_TABLET | ORAL | Status: DC

## 2012-03-22 NOTE — Telephone Encounter (Signed)
Ok for 2 wks sample, we usually have this particular one in "stock'

## 2012-03-22 NOTE — Telephone Encounter (Signed)
The patients Benicar HCT has been denied and will need to do PA on it asap .  The patients wife stated he only has 3 left, is there an alternative or samples of something to hold until PA has been completed.

## 2012-03-23 ENCOUNTER — Telehealth: Payer: Self-pay

## 2012-03-23 NOTE — Telephone Encounter (Signed)
PA for Benicar 20/12.5 has been approved from [facility] for 24 months.  [id number].  Patient has been informed as well as pharmacy.

## 2012-03-23 NOTE — Telephone Encounter (Signed)
Called the patients wife to come by and pickup samples to cover him until PA is completed.

## 2012-03-29 ENCOUNTER — Other Ambulatory Visit: Payer: Self-pay | Admitting: *Deleted

## 2012-03-29 MED ORDER — BECLOMETHASONE DIPROPIONATE 40 MCG/ACT IN AERS: 2.0000 | INHALATION_SPRAY | Freq: Two times a day (BID) | RESPIRATORY_TRACT | Status: DC

## 2012-03-29 NOTE — Telephone Encounter (Signed)
Per CY-okay to refill; I noticed patient has not been seen and MUST MAKE AND KEEP APPT FOR ANY MORE REFILLS.

## 2012-04-20 ENCOUNTER — Encounter: Payer: Self-pay | Admitting: Internal Medicine

## 2012-04-20 ENCOUNTER — Telehealth: Payer: Self-pay | Admitting: Internal Medicine

## 2012-04-20 ENCOUNTER — Ambulatory Visit (INDEPENDENT_AMBULATORY_CARE_PROVIDER_SITE_OTHER): Payer: BC Managed Care – PPO | Admitting: Internal Medicine

## 2012-04-20 VITALS — BP 132/68 | HR 59 | Temp 97.4°F | Ht 70.0 in | Wt 249.0 lb

## 2012-04-20 DIAGNOSIS — K120 Recurrent oral aphthae: Secondary | ICD-10-CM

## 2012-04-20 DIAGNOSIS — J029 Acute pharyngitis, unspecified: Secondary | ICD-10-CM

## 2012-04-20 MED ORDER — MAGIC MOUTHWASH W/LIDOCAINE: 5.0000 mL | Freq: Three times a day (TID) | ORAL | Status: DC | PRN

## 2012-04-20 NOTE — Patient Instructions (Signed)
Trench Mouth Trench mouth is a sudden, painful sore (ulceration) of the gums that is caused by bacteria. Trench mouth is a painful form of gingivitis. Gingivitis means redness and soreness of the gums. The term "trench mouth" comes from World War I. The condition was common when the disorder appeared among soldiers in the trenches. The mouth normally contains a balance of all the germs growing in it. When the balance is upset and too many germs start growing, it can cause painful ulcers on the gums. It is an uncommon disorder which usually affects young adults. CAUSES   Poor nutrition.  Poor oral hygiene.  Smoking.  Emotional distress. SYMPTOMS   Painful gums which are red, swollen, and covered with a grayish film. There may be destruction of tissue between the teeth due to the ulcerations.  Gum bleeding with even minor brushing or irritation.  Ulcerations of the gums along with a bad taste in the mouth and bad breath.  Fever and swollen glands in the neck. DIAGNOSIS  Your caregiver can usually make this diagnosis by a physical exam. Sometimes, X-rays and cultures may be used to help with the diagnosis. TREATMENT  Treatment is aimed at relief of symptoms and getting rid of the infection. Antibiotic medicine may be prescribed. HOME CARE INSTRUCTIONS  Good oral hygiene is necessary. Thorough toothbrushing and flossing must be performed often. This should be done after meals and at bedtime. Your caregiver may be able to help by suggesting a soothing rinse or the use of a local pain medicine, such as viscous lidocaine, before brushing.  Warm salt water rinses made up of 1 tsp of salt in 2 cups of warm water may be helpful. If the rinse is painful, add more water.  Only take over-the-counter or prescription medicines for pain, discomfort, or fever as directed by your caregiver.  Take your antibiotics as directed. Finish them even if you start to feel better.  Follow your dentist's  instructions for teeth cleaning during this infection. Follow up with all your caregivers as directed.  Avoid smoking, alcohol, and irritating foods. You will know immediately which foods irritate your mouth. Typically, these will be spicy, sour, or citrus foods. SEEK IMMEDIATE MEDICAL CARE IF:   You develop pain or swelling in your face.  You have a fever.  You are unable to take fluids or eat food comfortably.  Your medicines do not seem to be working and you feel you are getting worse.  Your medicines are not relieving your pain.  You develop a stiff neck or a severe headache, which is unrelieved with medicines. MAKE SURE YOU:  Understand these instructions.  Will watch your condition.  Will get help right away if you are not doing well or get worse. Document Released: 04/03/2004 Document Revised: 03/23/2011 Document Reviewed: 08/07/2010 [facility] Patient Information 2013 [place], [place].

## 2012-04-20 NOTE — Telephone Encounter (Signed)
Patient Information:  Caller Name: Sheralyn Boatman  Phone: [phone]  Patient: Vincent Mckay, Vincent Mckay  Gender: Male  DOB: 10-24-1950  Age: 62 Years  PCP: Oliver Barre (Adults only)  Office Follow Up:  Does the office need to follow up with this patient?: No  Instructions For The Office: N/A  RN Note:  On Doxicycline for Rosacea. At work; reports severe pain inteferring with eating.  Symptoms  Reason For Call & Symptoms: Pain left side of throat causing difficulty swallowing for past 2 days.  Has abscessed ulcer on left side of tongue since 04/16/12. Wife is Armed forces operational officer; treated tongue ulcer with topical anesthesia  Reviewed Health History In EMR: Yes  Reviewed Medications In EMR: Yes  Reviewed Allergies In EMR: Yes  Reviewed Surgeries / Procedures: Yes  Date of Onset of Symptoms: 04/18/2012  Guideline(s) Used:  Sore Throat  Disposition Per Guideline:   See Today in Office  Reason For Disposition Reached:   Severe sore throat pain  Advice Given:  For Relief of Sore Throat Pain:  Sip warm chicken broth or apple juice.  Suck on hard candy or a throat lozenge (over-the-counter).  Gargle warm salt water 3 times daily (1 teaspoon of salt in 8 oz or 240 ml of warm water).  Soft Diet:   Cold drinks and milk shakes are especially good (Reason: swollen tonsils can make some foods hard to swallow).  Liquids:  Adequate liquid intake is important to prevent dehydration. Drink 6-8 glasses of water per day.  Call Back If:  Sore throat is the main symptom and it lasts longer than 24 hours  Sore throat is mild but lasts longer than 4 days  You become worse.  Patient Will Follow Care Advice:  YES  Appointment Scheduled:  04/20/2012 15:15:00 Appointment Scheduled Provider:  Nicki Reaper

## 2012-04-20 NOTE — Progress Notes (Signed)
HPI  Pt presents to the clinic today with c/o sore throat and ulcer on tongue for 3 days. He has had difficulty swallowing or eating anything. He has never had ulcers in his mouth before. He did hurt his back 1 week ago and has been using a lot of ibuprofen, but he is not having any stomach issues. He is concerned that he may have strep throat.  Review of Systems      Past Medical History  Diagnosis Date  . HYPERLIPIDEMIA 12/01/2007  . HYPERTENSION 03/06/2008  . SINUSITIS- ACUTE-NOS 12/01/2007  . Chronic rhinitis 11/15/2008  . HAY FEVER 10/22/2006  . ASTHMA, UNSPECIFIED, UNSPECIFIED STATUS 10/05/2008  . JAUNDICE 10/22/2006  . Cough 11/15/2008    Family History  Problem Relation Age of Onset  . Arthritis Other   . Diabetes Other     History   Social History  . Marital Status: Married    Spouse Name: N/A    Number of Children: 2  . Years of Education: N/A   Occupational History  . [employer] sales    Social History Main Topics  . Smoking status: Never Smoker   . Smokeless tobacco: Never Used  . Alcohol Use: No  . Drug Use: No  . Sexually Active: Not on file   Other Topics Concern  . Not on file   Social History Narrative  . No narrative on file    Allergies  Allergen Reactions  . Ace Inhibitors     REACTION: Cough     Constitutional: Denies headache, fatigue, fever or abrupt weight changes.  HEENT:  Positive sore throat. Denies eye redness, eye pain, pressure behind the eyes, facial pain, nasal congestion, ear pain, ringing in the ears, wax buildup, runny nose or bloody nose. Respiratory:  Denies difficulty breathing or shortness of breath.  Cardiovascular: Denies chest pain, chest tightness, palpitations or swelling in the hands or feet.   No other specific complaints in a complete review of systems (except as listed in HPI above).  Objective:   BP 132/68  Pulse 59  Temp(Src) 97.4 F (36.3 C) (Oral)  Ht 5\' 10"  (1.778 m)  Wt 249  lb (112.946 kg)  BMI 35.73 kg/m2  SpO2 96% Wt Readings from Last 3 Encounters:  04/20/12 249 lb (112.946 kg)  09/21/11 244 lb 8 oz (110.904 kg)  12/12/10 245 lb (111.131 kg)     General: Appears his stated age, well developed, well nourished in NAD. HEENT: Head: normal shape and size; Eyes: sclera white, no icterus, conjunctiva pink, PERRLA and EOMs intact; Ears: Tm's gray and intact, normal light reflex; Nose: mucosa pink and moist, septum midline; Throat/Mouth: + PND. Teeth present, mucosa erythematous and moist, no exudate noted, small aphthous ulcer noted on left side of tongue.  Neck: Mild cervical lymphadenopathy. Neck supple, trachea midline. No massses, lumps or thyromegaly present.  Cardiovascular: Normal rate and rhythm. S1,S2 noted.  No murmur, rubs or gallops noted. No JVD or BLE edema. No carotid bruits noted. Pulmonary/Chest: Normal effort and positive vesicular breath sounds. No respiratory distress. No wheezes, rales or ronchi noted.      Assessment & Plan:   Pharyngitis with oral aphthous ulcer, new onset:  Get some rest and drink plenty of water Do salt water gargles for the sore throat eRx for magic mouthwash with lidocain  RTC as needed or if symptoms persist.

## 2012-09-29 ENCOUNTER — Encounter: Payer: Self-pay | Admitting: Internal Medicine

## 2012-09-29 ENCOUNTER — Ambulatory Visit (INDEPENDENT_AMBULATORY_CARE_PROVIDER_SITE_OTHER): Payer: BC Managed Care – PPO | Admitting: Internal Medicine

## 2012-09-29 ENCOUNTER — Other Ambulatory Visit (INDEPENDENT_AMBULATORY_CARE_PROVIDER_SITE_OTHER): Payer: BC Managed Care – PPO

## 2012-09-29 DIAGNOSIS — Z23 Encounter for immunization: Secondary | ICD-10-CM

## 2012-09-29 DIAGNOSIS — Z0000 Encounter for general adult medical examination without abnormal findings: Secondary | ICD-10-CM

## 2012-09-29 LAB — CBC WITH DIFFERENTIAL/PLATELET
Basophils Relative: 0.3 % (ref 0.0–3.0)
Hemoglobin: 15.3 g/dL (ref 13.0–17.0)
Lymphocytes Relative: 19.1 % (ref 12.0–46.0)
Monocytes Relative: 11.2 % (ref 3.0–12.0)
Neutro Abs: 4.5 10*3/uL (ref 1.4–7.7)
RBC: 5.24 Mil/uL (ref 4.22–5.81)
WBC: 6.9 10*3/uL (ref 4.5–10.5)

## 2012-09-29 LAB — BASIC METABOLIC PANEL
CO2: 28 mEq/L (ref 19–32)
Calcium: 10.2 mg/dL (ref 8.4–10.5)
Creatinine, Ser: 1 mg/dL (ref 0.4–1.5)
Glucose, Bld: 102 mg/dL — ABNORMAL HIGH (ref 70–99)

## 2012-09-29 LAB — URINALYSIS, ROUTINE W REFLEX MICROSCOPIC
Bilirubin Urine: NEGATIVE
Hgb urine dipstick: NEGATIVE
Ketones, ur: NEGATIVE
Leukocytes, UA: NEGATIVE
Specific Gravity, Urine: 1.025 (ref 1.000–1.030)
pH: 6 (ref 5.0–8.0)

## 2012-09-29 LAB — HEPATIC FUNCTION PANEL
Albumin: 4.5 g/dL (ref 3.5–5.2)
Bilirubin, Direct: 0.2 mg/dL (ref 0.0–0.3)
Total Protein: 7 g/dL (ref 6.0–8.3)

## 2012-09-29 LAB — LIPID PANEL
HDL: 39 mg/dL — ABNORMAL LOW (ref >39.00)
Total CHOL/HDL Ratio: 3
Triglycerides: 48 mg/dL (ref 0.0–149.0)

## 2012-09-29 MED ORDER — OLMESARTAN MEDOXOMIL-HCTZ 20-12.5 MG PO TABS: 1.0000 | ORAL_TABLET | Freq: Every day | ORAL | Status: DC

## 2012-09-29 MED ORDER — FLUTICASONE PROPIONATE 50 MCG/ACT NA SUSP: NASAL | Status: DC

## 2012-09-29 MED ORDER — ALBUTEROL SULFATE HFA 108 (90 BASE) MCG/ACT IN AERS: 2.0000 | INHALATION_SPRAY | Freq: Four times a day (QID) | RESPIRATORY_TRACT | Status: DC | PRN

## 2012-09-29 MED ORDER — BECLOMETHASONE DIPROPIONATE 40 MCG/ACT IN AERS: 2.0000 | INHALATION_SPRAY | Freq: Two times a day (BID) | RESPIRATORY_TRACT | Status: DC

## 2012-09-29 MED ORDER — ATORVASTATIN CALCIUM 10 MG PO TABS: 10.0000 mg | ORAL_TABLET | Freq: Every day | ORAL | Status: DC

## 2012-09-29 NOTE — Addendum Note (Signed)
Addended by: Scharlene Gloss B on: 09/29/2012 10:29 AM   Modules accepted: Orders

## 2012-09-29 NOTE — Assessment & Plan Note (Signed)

## 2012-09-29 NOTE — Progress Notes (Signed)
Subjective:    Patient ID: Vincent Mckay, male    DOB: 1950/10/22, 62 y.o.   MRN: [id number]  HPI Here for wellness and f/u;  Overall doing ok;  Pt denies CP, worsening SOB, DOE, wheezing, orthopnea, PND, worsening LE edema, palpitations, dizziness or syncope.  Pt denies neurological change such as new headache, facial or extremity weakness.  Pt denies polydipsia, polyuria, or low sugar symptoms. Pt states overall good compliance with treatment and medications, good tolerability, and has been trying to follow lower cholesterol diet.  Pt denies worsening depressive symptoms, suicidal ideation or panic. No fever, night sweats, wt loss, loss of appetite, or other constitutional symptoms.  Pt states good ability with ADL's, has low fall risk, home safety reviewed and adequate, no other significant changes in hearing or vision, and only occasionally active with exercise.  No acute complaints.  Due for flu, shingles shots Past Medical History  Diagnosis Date  . HYPERLIPIDEMIA 12/01/2007  . HYPERTENSION 03/06/2008  . SINUSITIS- ACUTE-NOS 12/01/2007  . Chronic rhinitis 11/15/2008  . HAY FEVER 10/22/2006  . ASTHMA, UNSPECIFIED, UNSPECIFIED STATUS 10/05/2008  . JAUNDICE 10/22/2006  . Cough 11/15/2008   Past Surgical History  Procedure Laterality Date  . S/p left bicep tendon rupture  2004  . Tonsillectomy    . Finger ganglion cyst excision      right index    reports that he has never smoked. He has never used smokeless tobacco. He reports that he does not drink alcohol or use illicit drugs. family history includes Arthritis in his other; Diabetes in his other. Allergies  Allergen Reactions  . Ace Inhibitors     REACTION: Cough   Current Outpatient Prescriptions on File Prior to Visit  Medication Sig Dispense Refill  . Alum & Mag Hydroxide-Simeth (MAGIC MOUTHWASH W/LIDOCAINE) SOLN Take 5 mLs by mouth 3 (three) times daily as needed.  120 mL  0  . clindamycin (CLEOCIN T) 1 % lotion Apply topically 2  (two) times daily.      Marland Kitchen co-enzyme Q-10 50 MG capsule Take 50 mg by mouth daily.        Marland Kitchen doxycycline (ADOXA) 50 MG tablet Take 50 mg by mouth daily.      . Ginkgo 60 MG TABS Take 1 tablet by mouth daily.        Marland Kitchen olmesartan-hydrochlorothiazide (BENICAR HCT) 20-12.5 MG per tablet TAKE 1 TABLET EVERY DAY  90 tablet  1  . Red Yeast Rice Extract (RED YEAST RICE PO) Take 1 tablet by mouth daily.        . vitamin C (ASCORBIC ACID) 500 MG tablet Take 500 mg by mouth daily.         No current facility-administered medications on file prior to visit.   Review of Systems Constitutional: Negative for diaphoresis, activity change, appetite change or unexpected weight change.  HENT: Negative for hearing loss, ear pain, facial swelling, mouth sores and neck stiffness.   Eyes: Negative for pain, redness and visual disturbance.  Respiratory: Negative for shortness of breath and wheezing.   Cardiovascular: Negative for chest pain and palpitations.  Gastrointestinal: Negative for diarrhea, blood in stool, abdominal distention or other pain Genitourinary: Negative for hematuria, flank pain or change in urine volume.  Musculoskeletal: Negative for myalgias and joint swelling.  Skin: Negative for color change and wound.  Neurological: Negative for syncope and numbness. other than noted Hematological: Negative for adenopathy.  Psychiatric/Behavioral: Negative for hallucinations, self-injury, decreased concentration and agitation.  Objective:   Physical Exam There were no vitals taken for this visit. VS noted,  Constitutional: Pt is oriented to person, place, and time. Appears well-developed and well-nourished.  Head: Normocephalic and atraumatic.  Right Ear: External ear normal.  Left Ear: External ear normal.  Nose: Nose normal.  Mouth/Throat: Oropharynx is clear and moist.  Eyes: Conjunctivae and EOM are normal. Pupils are equal, round, and reactive to light.  Neck: Normal range of motion. Neck  supple. No JVD present. No tracheal deviation present.  Cardiovascular: Normal rate, regular rhythm, normal heart sounds and intact distal pulses.   Pulmonary/Chest: Effort normal and breath sounds normal.  Abdominal: Soft. Bowel sounds are normal. There is no tenderness. No HSM  Musculoskeletal: Normal range of motion. Exhibits no edema.  Lymphadenopathy:  Has no cervical adenopathy.  Neurological: Pt is alert and oriented to person, place, and time. Pt has normal reflexes. No cranial nerve deficit.  Skin: Skin is warm and dry. No rash noted.  Psychiatric:  Has  normal mood and affect. Behavior is normal.      Assessment & Plan:

## 2012-09-29 NOTE — Patient Instructions (Addendum)
You had the flu shot today, and the shingles shot today Please continue all other medications as before, and refills have been done if requested. Please have the pharmacy call with any other refills you may need. Please continue your efforts at being more active, low cholesterol diet, and weight control. You are otherwise up to date with prevention measures today. Please go to the LAB in the Basement (turn left off the elevator) for the tests to be done today You will be contacted by phone if any changes need to be made immediately.  Otherwise, you will receive a letter about your results with an explanation, but please check with MyChart first.  Please remember to sign up for My Chart if you have not done so, as this will be important to you in the future with finding out test results, communicating by private email, and scheduling acute appointments online when needed.  Please return in 1 year for your yearly visit, or sooner if needed, with Lab testing done 3-5 days before

## 2012-10-06 ENCOUNTER — Other Ambulatory Visit: Payer: Self-pay

## 2012-10-06 MED ORDER — FLUTICASONE PROPIONATE 50 MCG/ACT NA SUSP: NASAL | Status: DC

## 2012-10-06 MED ORDER — OLMESARTAN MEDOXOMIL-HCTZ 20-12.5 MG PO TABS: 1.0000 | ORAL_TABLET | Freq: Every day | ORAL | Status: DC

## 2012-10-06 MED ORDER — ALBUTEROL SULFATE HFA 108 (90 BASE) MCG/ACT IN AERS: 2.0000 | INHALATION_SPRAY | Freq: Four times a day (QID) | RESPIRATORY_TRACT | Status: DC | PRN

## 2012-10-06 MED ORDER — ATORVASTATIN CALCIUM 10 MG PO TABS: 10.0000 mg | ORAL_TABLET | Freq: Every day | ORAL | Status: DC

## 2012-10-06 MED ORDER — BECLOMETHASONE DIPROPIONATE 40 MCG/ACT IN AERS: 2.0000 | INHALATION_SPRAY | Freq: Two times a day (BID) | RESPIRATORY_TRACT | Status: DC

## 2012-10-06 NOTE — Telephone Encounter (Signed)
Called the patients wife left message sent prescriptions as requested to [facility].

## 2012-10-12 ENCOUNTER — Telehealth: Payer: Self-pay | Admitting: *Deleted

## 2012-10-12 NOTE — Telephone Encounter (Signed)
A user error has taken place.

## 2012-10-20 ENCOUNTER — Other Ambulatory Visit: Payer: Self-pay | Admitting: Internal Medicine

## 2012-11-15 ENCOUNTER — Ambulatory Visit (INDEPENDENT_AMBULATORY_CARE_PROVIDER_SITE_OTHER): Payer: BC Managed Care – PPO | Admitting: Internal Medicine

## 2012-11-15 ENCOUNTER — Encounter: Payer: Self-pay | Admitting: Internal Medicine

## 2012-11-15 VITALS — BP 120/68 | HR 66 | Ht 69.0 in | Wt 248.8 lb

## 2012-11-15 DIAGNOSIS — J45909 Unspecified asthma, uncomplicated: Secondary | ICD-10-CM

## 2012-11-15 DIAGNOSIS — J310 Chronic rhinitis: Secondary | ICD-10-CM

## 2012-11-15 MED ORDER — IPRATROPIUM BROMIDE 0.03 % NA SOLN: NASAL | Status: DC

## 2012-11-15 NOTE — Progress Notes (Signed)
11/15/12- 62 yoM never smoker followed for hx asthma, allergic rhinitis FOLLOWS FOR: routine check up; pt doing well over all. Has gotten rid of mold/allergy issues since last seen in 2012. Wife wanted him to maintain contact with this office. Occasional rhinorrhea-Claritin not enough. Chlor-Trimeton made him too sleepy. Change from ACE inhibitor to Benicar cleared his cough problem. Using pro-air, Qvar and  Flonase once daily  ROS-see HPI Constitutional:   No-   weight loss, night sweats, fevers, chills, fatigue, lassitude. HEENT:   No-  headaches, difficulty swallowing, tooth/dental problems, sore throat,       No-  sneezing, itching, ear ache, nasal congestion, post nasal drip,  CV:  No-   chest pain, orthopnea, PND, swelling in lower extremities, anasarca, dizziness, palpitations Resp: No-   shortness of breath with exertion or at rest.              No-   productive cough,  No non-productive cough,  No- coughing up of blood.              No-   change in color of mucus.  No- wheezing.   Skin: No-   rash or lesions. GI:  No-   heartburn, indigestion, abdominal pain, nausea, vomiting,  GU:  MS:  No-   joint pain or swelling.  . Neuro-     nothing unusual Psych:  No- change in mood or affect. No depression or anxiety.  No memory loss.  OBJ- Physical Exam General- Alert, Oriented, Affect-appropriate, Distress- none acute, overweight Skin- rash-none, lesions- none, excoriation- none Lymphadenopathy- none Head- atraumatic            Eyes- Gross vision intact, PERRLA, conjunctivae and secretions clear            Ears- Hearing, canals-normal            Nose- Clear, no-Septal dev, mucus, polyps, erosion, perforation             Throat- Mallampati II , mucosa clear , drainage- none, tonsils- atrophic Neck- flexible , trachea midline, no stridor , thyroid nl, carotid no bruit Chest - symmetrical excursion , unlabored           Heart/CV- RRR , no murmur , no gallop  , no rub, nl s1 s2                   - JVD- none , edema- none, stasis changes- none, varices- none           Lung- clear to P&A, wheeze- none, cough- none , dullness-none, rub- none           Chest wall-  Abd-  Br/ Gen/ Rectal- Not done, not indicated Extrem- cyanosis- none, clubbing, none, atrophy- none, strength- nl Neuro- grossly intact to observation

## 2012-11-15 NOTE — Patient Instructions (Signed)
Ok to continue your inhaled nose and chest meds as you are doing if you find it works best. Vincent Mckay can experiment with skipping the proair bronchodilator inhaler   For the episodes of watery nose- try a non-sedating antihistamine like Allegra/ fexofenadine. Zyrtec/ cetirizine also is good, but some people need to take it at bedtime for mild sedation.  Script to try ipratropium nasal spray on an as-needed basis. It can work by itself or be used along with the antihistamines and flonase.  Please call as needed

## 2012-11-29 NOTE — Assessment & Plan Note (Signed)
Probably vasomotor/nonallergic rhinitis Plan-try Astelin nasal spray, ipratropium nasal spray and oral antihistamines for comparison with each other

## 2012-11-29 NOTE — Assessment & Plan Note (Signed)
Adequate control. 

## 2013-01-20 ENCOUNTER — Telehealth: Payer: Self-pay

## 2013-01-20 MED ORDER — BECLOMETHASONE DIPROPIONATE 40 MCG/ACT IN AERS: 2.0000 | INHALATION_SPRAY | Freq: Two times a day (BID) | RESPIRATORY_TRACT | Status: DC

## 2013-01-20 NOTE — Telephone Encounter (Signed)
The patient called and is hoping to get a refill of his qvar rx.  Callback - [phone]

## 2013-01-20 NOTE — Telephone Encounter (Signed)
Ok to refill Qvar for 1 year. We want to see him around November, 2015- one year after last ov unless needed sooner.

## 2013-01-20 NOTE — Telephone Encounter (Signed)
Called and spoke with pts wife and she is aware of refills that have been sent to the pharmacy.  She is aware that pt will need to be seen by CY in November 2015,

## 2013-10-03 ENCOUNTER — Encounter: Payer: Self-pay | Admitting: Internal Medicine

## 2013-10-03 ENCOUNTER — Ambulatory Visit (INDEPENDENT_AMBULATORY_CARE_PROVIDER_SITE_OTHER): Payer: BC Managed Care – PPO | Admitting: Internal Medicine

## 2013-10-03 ENCOUNTER — Telehealth: Payer: Self-pay | Admitting: Internal Medicine

## 2013-10-03 VITALS — BP 122/68 | HR 71 | Ht 69.0 in | Wt 248.4 lb

## 2013-10-03 DIAGNOSIS — J4540 Moderate persistent asthma, uncomplicated: Secondary | ICD-10-CM

## 2013-10-03 MED ORDER — ATORVASTATIN CALCIUM 10 MG PO TABS: 10.0000 mg | ORAL_TABLET | Freq: Every day | ORAL | Status: DC

## 2013-10-03 MED ORDER — OLMESARTAN MEDOXOMIL-HCTZ 20-12.5 MG PO TABS: 1.0000 | ORAL_TABLET | Freq: Every day | ORAL | Status: DC

## 2013-10-03 MED ORDER — ALBUTEROL SULFATE HFA 108 (90 BASE) MCG/ACT IN AERS: 2.0000 | INHALATION_SPRAY | Freq: Four times a day (QID) | RESPIRATORY_TRACT | Status: DC | PRN

## 2013-10-03 MED ORDER — FLUTICASONE PROPIONATE 50 MCG/ACT NA SUSP: NASAL | Status: DC

## 2013-10-03 MED ORDER — BECLOMETHASONE DIPROPIONATE 40 MCG/ACT IN AERS: 2.0000 | INHALATION_SPRAY | Freq: Two times a day (BID) | RESPIRATORY_TRACT | Status: DC

## 2013-10-03 NOTE — Telephone Encounter (Signed)
Printed prescriptions and MD  Signed. Called the wife informed hardcopy's are ready for pickup at the front desk. As requested.

## 2013-10-03 NOTE — Patient Instructions (Addendum)
Refills printed for Proair, Flonase and Qvar  Flu vax  Please call as needed

## 2013-10-03 NOTE — Telephone Encounter (Signed)
Pt came by to request Rx refills for generic brands of BENICAR and LIPITOR. Pt requests that these meds NOT be sent to his pharmacy but he or his wife be contacted to pick up scripts in office. Please contact pt when requests have been reviewed.

## 2013-10-03 NOTE — Progress Notes (Signed)
11/15/12- 86 yoM never smoker followed for hx asthma, allergic rhinitis FOLLOWS FOR: routine check up; pt doing well over all. Has gotten rid of mold/allergy issues since last seen in 2012. Wife wanted him to maintain contact with this office. Occasional rhinorrhea-Claritin not enough. Chlor-Trimeton made him too sleepy. Change from ACE inhibitor to Benicar cleared his cough problem. Using pro-air, Qvar and  Flonase once daily  10/03/13- 63 yoM never smoker followed for hx asthma, allergic rhinitis FOLLOWS FOR: no complaints; just here for yearly follow up and get refills needed.  Please print all Rx's.   ROS-see HPI Constitutional:   No-   weight loss, night sweats, fevers, chills, fatigue, lassitude. HEENT:   No-  headaches, difficulty swallowing, tooth/dental problems, sore throat,       No-  sneezing, itching, ear ache, nasal congestion, post nasal drip,  CV:  No-   chest pain, orthopnea, PND, swelling in lower extremities, anasarca, dizziness, palpitations Resp: No-   shortness of breath with exertion or at rest.              No-   productive cough,  No non-productive cough,  No- coughing up of blood.              No-   change in color of mucus.  No- wheezing.   Skin: No-   rash or lesions. GI:  No-   heartburn, indigestion, abdominal pain, nausea, vomiting,  GU:  MS:  No-   joint pain or swelling.  . Neuro-     nothing unusual Psych:  No- change in mood or affect. No depression or anxiety.  No memory loss.  OBJ- Physical Exam General- Alert, Oriented, Affect-appropriate, Distress- none acute, overweight Skin- rash-none, lesions- none, excoriation- none Lymphadenopathy- none Head- atraumatic            Eyes- Gross vision intact, PERRLA, conjunctivae and secretions clear            Ears- Hearing, canals-normal            Nose- Clear, no-Septal dev, mucus, polyps, erosion, perforation             Throat- Mallampati II , mucosa clear , drainage- none, tonsils- atrophic Neck-  flexible , trachea midline, no stridor , thyroid nl, carotid no bruit Chest - symmetrical excursion , unlabored           Heart/CV- RRR , no murmur , no gallop  , no rub, nl s1 s2                           - JVD- none , edema- none, stasis changes- none, varices- none           Lung- clear to P&A, wheeze- none, cough- none , dullness-none, rub- none           Chest wall-  Abd-  Br/ Gen/ Rectal- Not done, not indicated Extrem- cyanosis- none, clubbing, none, atrophy- none, strength- nl Neuro- grossly intact to observation

## 2014-04-19 ENCOUNTER — Telehealth: Payer: Self-pay | Admitting: Internal Medicine

## 2014-04-19 NOTE — Telephone Encounter (Signed)
Called insurance spoke with PA rep gave her clinical inform ation regarding PA med was approved. Notified pharmacy with approval.../lmb

## 2014-04-19 NOTE — Telephone Encounter (Signed)
Pt ACTUALLY need PA for Benicar. Received fax will contact insurance to get PA...Vincent Mckay

## 2014-04-19 NOTE — Telephone Encounter (Signed)
[facility] states [facility] [place] [facility] stating patient can only take benicar HCT in order for them to use the discount coupon.

## 2014-09-18 ENCOUNTER — Other Ambulatory Visit: Payer: Self-pay | Admitting: Internal Medicine

## 2014-09-25 ENCOUNTER — Ambulatory Visit (INDEPENDENT_AMBULATORY_CARE_PROVIDER_SITE_OTHER): Payer: BLUE CROSS/BLUE SHIELD | Admitting: Internal Medicine

## 2014-09-25 ENCOUNTER — Encounter: Payer: Self-pay | Admitting: Internal Medicine

## 2014-09-25 ENCOUNTER — Other Ambulatory Visit: Payer: Self-pay

## 2014-09-25 VITALS — BP 156/78 | HR 83 | Temp 98.1°F | Ht 69.0 in | Wt 247.0 lb

## 2014-09-25 DIAGNOSIS — Z0000 Encounter for general adult medical examination without abnormal findings: Secondary | ICD-10-CM | POA: Diagnosis not present

## 2014-09-25 MED ORDER — ATORVASTATIN CALCIUM 10 MG PO TABS: 10.0000 mg | ORAL_TABLET | Freq: Every day | ORAL | Status: DC

## 2014-09-25 MED ORDER — OLMESARTAN MEDOXOMIL-HCTZ 20-12.5 MG PO TABS: 1.0000 | ORAL_TABLET | Freq: Every day | ORAL | Status: DC

## 2014-09-25 NOTE — Progress Notes (Signed)
Pre visit review using our clinic review tool, if applicable. No additional management support is needed unless otherwise documented below in the visit note. 

## 2014-09-25 NOTE — Progress Notes (Signed)
Subjective:    Patient ID: Vincent Mckay, male    DOB: 01/26/1950, 64 y.o.   MRN: [id number]  HPI Here for wellness and f/u;  Overall doing ok;  Pt denies Chest pain, worsening SOB, DOE, wheezing, orthopnea, PND, worsening LE edema, palpitations, dizziness or syncope.  Pt denies neurological change such as new headache, facial or extremity weakness.  Pt denies polydipsia, polyuria, or low sugar symptoms. Pt states overall good compliance with treatment and medications, good tolerability, and has been trying to follow appropriate diet.  Pt denies worsening depressive symptoms, suicidal ideation or panic. No fever, night sweats, wt loss, loss of appetite, or other constitutional symptoms.  Pt states good ability with ADL's, has low fall risk, home safety reviewed and adequate, no other significant changes in hearing or vision, and only occasionally active with exercise. Wt Readings from Last 3 Encounters:  09/25/14 247 lb (112.038 kg)  10/03/13 248 lb 6.4 oz (112.674 kg)  11/15/12 248 lb 12.8 oz (112.855 kg)  / Past Medical History  Diagnosis Date  . HYPERLIPIDEMIA 12/01/2007  . HYPERTENSION 03/06/2008  . SINUSITIS- ACUTE-NOS 12/01/2007  . Chronic rhinitis 11/15/2008  . HAY FEVER 10/22/2006  . ASTHMA, UNSPECIFIED, UNSPECIFIED STATUS 10/05/2008  . JAUNDICE 10/22/2006  . Cough 11/15/2008   Past Surgical History  Procedure Laterality Date  . S/p left bicep tendon rupture  2004  . Tonsillectomy    . Finger ganglion cyst excision      right index    reports that he has never smoked. He has never used smokeless tobacco. He reports that he does not drink alcohol or use illicit drugs. family history includes Arthritis in his other; Diabetes in his other. Allergies  Allergen Reactions  . Ace Inhibitors     REACTION: Cough   Current Outpatient Prescriptions on File Prior to Visit  Medication Sig Dispense Refill  . albuterol (PROAIR HFA) 108 (90 BASE) MCG/ACT inhaler Inhale 2 puffs into the lungs  every 6 (six) hours as needed for wheezing or shortness of breath. 3 Inhaler 3  . beclomethasone (QVAR) 40 MCG/ACT inhaler Inhale 2 puffs into the lungs 2 (two) times daily. Rinse two times a day 3 Inhaler 3  . fluticasone (FLONASE) 50 MCG/ACT nasal spray 2 sprays each nostril once daily 48 g 3  . clindamycin (CLEOCIN T) 1 % lotion Apply topically 2 (two) times daily.    Marland Kitchen co-enzyme Q-10 50 MG capsule Take 50 mg by mouth daily.      . Ginkgo 60 MG TABS Take 1 tablet by mouth daily.      . Red Yeast Rice Extract (RED YEAST RICE PO) Take 1 tablet by mouth daily.      . SOOLANTRA 1 % CREA Use as directed    . vitamin C (ASCORBIC ACID) 500 MG tablet Take 500 mg by mouth daily.       No current facility-administered medications on file prior to visit.   Review of Systems Constitutional: Negative for increased diaphoresis, other activity, appetite or siginficant weight change other than noted HENT: Negative for worsening hearing loss, ear pain, facial swelling, mouth sores and neck stiffness.   Eyes: Negative for other worsening pain, redness or visual disturbance.  Respiratory: Negative for shortness of breath and wheezing  Cardiovascular: Negative for chest pain and palpitations.  Gastrointestinal: Negative for diarrhea, blood in stool, abdominal distention or other pain Genitourinary: Negative for hematuria, flank pain or change in urine volume.  Musculoskeletal: Negative for myalgias or  other joint complaints.  Skin: Negative for color change and wound or drainage.  Neurological: Negative for syncope and numbness. other than noted Hematological: Negative for adenopathy. or other swelling Psychiatric/Behavioral: Negative for hallucinations, SI, self-injury, decreased concentration or other worsening agitation.      Objective:   Physical Exam BP 156/78 mmHg  Pulse 83  Temp(Src) 98.1 F (36.7 C) (Oral)  Ht 5\' 9"  (1.753 m)  Wt 247 lb (112.038 kg)  BMI 36.46 kg/m2  SpO2 96% VS noted,    Constitutional: Pt is oriented to person, place, and time. Appears well-developed and well-nourished, in no significant distress Head: Normocephalic and atraumatic.  Right Ear: External ear normal.  Left Ear: External ear normal.  Nose: Nose normal.  Mouth/Throat: Oropharynx is clear and moist.  Eyes: Conjunctivae and EOM are normal. Pupils are equal, round, and reactive to light.  Neck: Normal range of motion. Neck supple. No JVD present. No tracheal deviation present or significant neck LA or mass Cardiovascular: Normal rate, regular rhythm, normal heart sounds and intact distal pulses.   Pulmonary/Chest: Effort normal and breath sounds without rales or wheezing  Abdominal: Soft. Bowel sounds are normal. NT. No HSM  Musculoskeletal: Normal range of motion. Exhibits no edema.  Lymphadenopathy:  Has no cervical adenopathy.  Neurological: Pt is alert and oriented to person, place, and time. Pt has normal reflexes. No cranial nerve deficit. Motor grossly intact Skin: Skin is warm and dry. No rash noted.  Psychiatric:  Has normal mood and affect. Behavior is normal.     Assessment & Plan:

## 2014-09-25 NOTE — Patient Instructions (Signed)
Please continue all other medications as before, and refills have been done if requested.  Please have the pharmacy call with any other refills you may need.  Please continue your efforts at being more active, low cholesterol diet, and weight control.  You are otherwise up to date with prevention measures today.  Please keep your appointments with your specialists as you may have planned  Please go to the LAB in the Basement (turn left off the elevator) for the tests to be done at your convenience  .Please remember to sign up for MyChart if you have not done so, as this will be important to you in the future with finding out test results, communicating by private email, and scheduling acute appointments online when needed.  Please return in 1 year for your yearly visit, or sooner if needed, with Lab testing done 3-5 days before

## 2014-09-30 NOTE — Assessment & Plan Note (Signed)

## 2014-10-02 ENCOUNTER — Other Ambulatory Visit: Payer: Self-pay | Admitting: Internal Medicine

## 2015-01-02 ENCOUNTER — Other Ambulatory Visit: Payer: Self-pay | Admitting: Internal Medicine

## 2015-04-12 ENCOUNTER — Other Ambulatory Visit: Payer: Self-pay | Admitting: Internal Medicine

## 2015-04-12 DIAGNOSIS — L989 Disorder of the skin and subcutaneous tissue, unspecified: Secondary | ICD-10-CM

## 2015-04-12 MED ORDER — OLMESARTAN MEDOXOMIL-HCTZ 20-12.5 MG PO TABS: 1.0000 | ORAL_TABLET | Freq: Every day | ORAL | Status: DC

## 2015-04-12 NOTE — Telephone Encounter (Signed)
Derm per wife request  Refill done for BP med

## 2015-08-27 ENCOUNTER — Encounter: Payer: Self-pay | Admitting: *Deleted

## 2015-09-12 ENCOUNTER — Other Ambulatory Visit (INDEPENDENT_AMBULATORY_CARE_PROVIDER_SITE_OTHER): Payer: BLUE CROSS/BLUE SHIELD

## 2015-09-12 ENCOUNTER — Encounter: Payer: Self-pay | Admitting: Internal Medicine

## 2015-09-12 ENCOUNTER — Ambulatory Visit (INDEPENDENT_AMBULATORY_CARE_PROVIDER_SITE_OTHER): Payer: BLUE CROSS/BLUE SHIELD | Admitting: Internal Medicine

## 2015-09-12 VITALS — BP 136/76 | HR 64 | Temp 98.4°F | Resp 20 | Wt 260.0 lb

## 2015-09-12 DIAGNOSIS — Z0000 Encounter for general adult medical examination without abnormal findings: Secondary | ICD-10-CM

## 2015-09-12 DIAGNOSIS — R739 Hyperglycemia, unspecified: Secondary | ICD-10-CM

## 2015-09-12 DIAGNOSIS — Z1159 Encounter for screening for other viral diseases: Secondary | ICD-10-CM | POA: Diagnosis not present

## 2015-09-12 DIAGNOSIS — E785 Hyperlipidemia, unspecified: Secondary | ICD-10-CM

## 2015-09-12 DIAGNOSIS — J4530 Mild persistent asthma, uncomplicated: Secondary | ICD-10-CM

## 2015-09-12 DIAGNOSIS — I10 Essential (primary) hypertension: Secondary | ICD-10-CM | POA: Diagnosis not present

## 2015-09-12 HISTORY — DX: Hyperglycemia, unspecified: R73.9

## 2015-09-12 LAB — LIPID PANEL
CHOL/HDL RATIO: 3
Cholesterol: 111 mg/dL (ref 0–200)
HDL: 32.4 mg/dL — ABNORMAL LOW (ref >39.00)
LDL CALC: 67 mg/dL (ref 0–99)
NonHDL: 78.6
TRIGLYCERIDES: 57 mg/dL (ref 0.0–149.0)
VLDL: 11.4 mg/dL (ref 0.0–40.0)

## 2015-09-12 LAB — HEPATIC FUNCTION PANEL
ALK PHOS: 38 U/L — AB (ref 39–117)
ALT: 37 U/L (ref 0–53)
AST: 35 U/L (ref 0–37)
Albumin: 4.3 g/dL (ref 3.5–5.2)
BILIRUBIN DIRECT: 0.1 mg/dL (ref 0.0–0.3)
TOTAL PROTEIN: 6.3 g/dL (ref 6.0–8.3)
Total Bilirubin: 0.6 mg/dL (ref 0.2–1.2)

## 2015-09-12 LAB — CBC WITH DIFFERENTIAL/PLATELET
BASOS ABS: 0 10*3/uL (ref 0.0–0.1)
Basophils Relative: 0.5 % (ref 0.0–3.0)
EOS ABS: 0.3 10*3/uL (ref 0.0–0.7)
Eosinophils Relative: 4.5 % (ref 0.0–5.0)
HCT: 40.8 % (ref 39.0–52.0)
Hemoglobin: 14 g/dL (ref 13.0–17.0)
LYMPHS ABS: 0.9 10*3/uL (ref 0.7–4.0)
Lymphocytes Relative: 16.3 % (ref 12.0–46.0)
MCHC: 34.3 g/dL (ref 30.0–36.0)
MCV: 85.9 fl (ref 78.0–100.0)
MONO ABS: 0.7 10*3/uL (ref 0.1–1.0)
Monocytes Relative: 12.8 % — ABNORMAL HIGH (ref 3.0–12.0)
NEUTROS ABS: 3.7 10*3/uL (ref 1.4–7.7)
Neutrophils Relative %: 65.9 % (ref 43.0–77.0)
PLATELETS: 199 10*3/uL (ref 150.0–400.0)
RBC: 4.76 Mil/uL (ref 4.22–5.81)
RDW: 12.7 % (ref 11.5–15.5)
WBC: 5.7 10*3/uL (ref 4.0–10.5)

## 2015-09-12 LAB — TSH: TSH: 1.17 u[IU]/mL (ref 0.35–4.50)

## 2015-09-12 LAB — BASIC METABOLIC PANEL
BUN: 29 mg/dL — ABNORMAL HIGH (ref 6–23)
CO2: 26 mEq/L (ref 19–32)
Calcium: 9.1 mg/dL (ref 8.4–10.5)
Chloride: 106 mEq/L (ref 96–112)
Creatinine, Ser: 1.04 mg/dL (ref 0.40–1.50)
GFR: 76.04 mL/min (ref >60.00)
GLUCOSE: 116 mg/dL — AB (ref 70–99)
POTASSIUM: 4.2 meq/L (ref 3.5–5.1)
Sodium: 140 mEq/L (ref 135–145)

## 2015-09-12 LAB — URINALYSIS, ROUTINE W REFLEX MICROSCOPIC
BILIRUBIN URINE: NEGATIVE
Hgb urine dipstick: NEGATIVE
KETONES UR: NEGATIVE
Leukocytes, UA: NEGATIVE
Nitrite: NEGATIVE
PH: 5.5 (ref 5.0–8.0)
Specific Gravity, Urine: 1.025 (ref 1.000–1.030)
TOTAL PROTEIN, URINE-UPE24: NEGATIVE
UROBILINOGEN UA: 0.2 (ref 0.0–1.0)
Urine Glucose: NEGATIVE

## 2015-09-12 LAB — PSA: PSA: 1.24 ng/mL (ref 0.10–4.00)

## 2015-09-12 MED ORDER — ATORVASTATIN CALCIUM 10 MG PO TABS: 10.0000 mg | ORAL_TABLET | Freq: Every day | ORAL | 3 refills | Status: DC

## 2015-09-12 MED ORDER — OLMESARTAN MEDOXOMIL-HCTZ 20-12.5 MG PO TABS: 1.0000 | ORAL_TABLET | Freq: Every day | ORAL | 3 refills | Status: DC

## 2015-09-12 NOTE — Progress Notes (Signed)
Pre visit review using our clinic review tool, if applicable. No additional management support is needed unless otherwise documented below in the visit note. 

## 2015-09-12 NOTE — Patient Instructions (Addendum)
You had the flu shot today  Please return for a Nurse Visit in 2 weeks for the Prevnar 13  Please continue all other medications as before, and refills have been done if requested.  Please have the pharmacy call with any other refills you may need.  Please continue your efforts at being more active, low cholesterol diet, and weight control.  You are otherwise up to date with prevention measures today.  Please keep your appointments with your specialists as you may have planned  Please return in 1 year for your yearly visit, or sooner if needed, with Lab testing done 3-5 days before

## 2015-09-12 NOTE — Assessment & Plan Note (Signed)
stable overall by history and exam, recent data reviewed with pt, and pt to continue medical treatment as before,  to f/u any worsening symptoms or concerns BP Readings from Last 3 Encounters:  09/12/15 136/76  09/25/14 (!) 156/78  10/03/13 122/68

## 2015-09-12 NOTE — Assessment & Plan Note (Signed)

## 2015-09-12 NOTE — Assessment & Plan Note (Signed)
Goal ldl < 100 - for lab f/u, cont liptior, stable overall by history and exam, recent data reviewed with pt, and pt to continue medical treatment as before,  to f/u any worsening symptoms or concerns

## 2015-09-12 NOTE — Assessment & Plan Note (Signed)
stable overall by history and exam, recent data reviewed with pt, and pt to continue medical treatment as before,  to f/u any worsening symptoms or concerns  

## 2015-09-12 NOTE — Progress Notes (Signed)
Letter done

## 2015-09-12 NOTE — Progress Notes (Signed)
Subjective:    Patient ID: Vincent Mckay, male    DOB: 1950/07/22, 65 y.o.   MRN: [id number]  HPI  Here for wellness and f/u;  Overall doing ok;  Pt denies Chest pain, worsening SOB, DOE, wheezing, orthopnea, PND, worsening LE edema, palpitations, dizziness or syncope.  Pt denies neurological change such as new headache, facial or extremity weakness.  Pt denies polydipsia, polyuria, or low sugar symptoms. Pt states overall good compliance with treatment and medications, good tolerability, and has been trying to follow appropriate diet.  Pt denies worsening depressive symptoms, suicidal ideation or panic. No fever, night sweats, wt loss, loss of appetite, or other constitutional symptoms.  Pt states good ability with ADL's, has low fall risk, home safety reviewed and adequate, no other significant changes in hearing or vision, and only occasionally active with exercise.  BP at home similar to today Past Medical History:  Diagnosis Date  . ASTHMA, UNSPECIFIED, UNSPECIFIED STATUS 10/05/2008  . Chronic rhinitis 11/15/2008  . Cough 11/15/2008  . HAY FEVER 10/22/2006  . HYPERLIPIDEMIA 12/01/2007  . HYPERTENSION 03/06/2008  . JAUNDICE 10/22/2006  . SINUSITIS- ACUTE-NOS 12/01/2007   Past Surgical History:  Procedure Laterality Date  . FINGER GANGLION CYST EXCISION     right index  . s/p left bicep tendon rupture  2004  . TONSILLECTOMY      reports that he has never smoked. He has never used smokeless tobacco. He reports that he does not drink alcohol or use drugs. family history includes Arthritis in his other; Diabetes in his other. Allergies  Allergen Reactions  . Ace Inhibitors     REACTION: Cough   Current Outpatient Prescriptions on File Prior to Visit  Medication Sig Dispense Refill  . clindamycin (CLEOCIN T) 1 % lotion Apply topically 2 (two) times daily.    Marland Kitchen co-enzyme Q-10 50 MG capsule Take 50 mg by mouth daily.      . Ginkgo 60 MG TABS Take 1 tablet by mouth daily.      Marland Kitchen PROAIR HFA  108 (90 BASE) MCG/ACT inhaler INHALE 2 PUFFS INTO THE LUNGS EVERY 6 HOURS AS NEEDED FOR WHEEZING OR SHORTNESS OF BREATH 25.5 Inhaler 1  . Red Yeast Rice Extract (RED YEAST RICE PO) Take 1 tablet by mouth daily.      . SOOLANTRA 1 % CREA Use as directed    . vitamin C (ASCORBIC ACID) 500 MG tablet Take 500 mg by mouth daily.      . beclomethasone (QVAR) 40 MCG/ACT inhaler Inhale 2 puffs into the lungs 2 (two) times daily. Rinse two times a day 3 Inhaler 3  . fluticasone (FLONASE) 50 MCG/ACT nasal spray 2 sprays each nostril once daily 48 g 3   No current facility-administered medications on file prior to visit.    Review of Systems Constitutional: Negative for increased diaphoresis, or other activity, appetite or siginficant weight change other than noted HENT: Negative for worsening hearing loss, ear pain, facial swelling, mouth sores and neck stiffness.   Eyes: Negative for other worsening pain, redness or visual disturbance.  Respiratory: Negative for choking or stridor Cardiovascular: Negative for other chest pain and palpitations.  Gastrointestinal: Negative for worsening diarrhea, blood in stool, or abdominal distention Genitourinary: Negative for hematuria, flank pain or change in urine volume.  Musculoskeletal: Negative for myalgias or other joint complaints.  Skin: Negative for other color change and wound or drainage.  Neurological: Negative for syncope and numbness. other than noted Hematological: Negative for  adenopathy. or other swelling Psychiatric/Behavioral: Negative for hallucinations, SI, self-injury, decreased concentration or other worsening agitation.      Objective:   Physical Exam BP 136/76   Pulse 64   Temp 98.4 F (36.9 C) (Oral)   Resp 20   Wt 260 lb (117.9 kg)   SpO2 97%   BMI 38.40 kg/m  VS noted, obese Constitutional: Pt is oriented to person, place, and time. Appears well-developed and well-nourished, in no significant distress Head: Normocephalic and  atraumatic  Eyes: Conjunctivae and EOM are normal. Pupils are equal, round, and reactive to light Right Ear: External ear normal.  Left Ear: External ear normal Nose: Nose normal.  Mouth/Throat: Oropharynx is clear and moist  Neck: Normal range of motion. Neck supple. No JVD present. No tracheal deviation present or significant neck LA or mass Cardiovascular: Normal rate, regular rhythm, normal heart sounds and intact distal pulses.   Pulmonary/Chest: Effort normal and breath sounds without rales or wheezing  Abdominal: Soft. Bowel sounds are normal. NT. No HSM  Musculoskeletal: Normal range of motion. Exhibits no edema Lymphadenopathy: Has no cervical adenopathy.  Neurological: Pt is alert and oriented to person, place, and time. Pt has normal reflexes. No cranial nerve deficit. Motor grossly intact Skin: Skin is warm and dry. No rash noted or new ulcers Psychiatric:  Has normal mood and affect. Behavior is normal.     Assessment & Plan:

## 2015-09-13 LAB — HEPATITIS C ANTIBODY: HCV Ab: NEGATIVE

## 2015-09-23 ENCOUNTER — Encounter: Payer: Self-pay | Admitting: Internal Medicine

## 2015-10-03 ENCOUNTER — Ambulatory Visit (INDEPENDENT_AMBULATORY_CARE_PROVIDER_SITE_OTHER): Payer: BLUE CROSS/BLUE SHIELD | Admitting: General Practice

## 2015-10-03 DIAGNOSIS — Z23 Encounter for immunization: Secondary | ICD-10-CM

## 2015-10-06 ENCOUNTER — Other Ambulatory Visit: Payer: Self-pay | Admitting: Internal Medicine

## 2016-03-02 ENCOUNTER — Other Ambulatory Visit: Payer: Self-pay | Admitting: *Deleted

## 2016-03-02 ENCOUNTER — Ambulatory Visit (INDEPENDENT_AMBULATORY_CARE_PROVIDER_SITE_OTHER): Payer: Medicare HMO | Admitting: Acute Care

## 2016-03-02 ENCOUNTER — Encounter: Payer: Self-pay | Admitting: Acute Care

## 2016-03-02 ENCOUNTER — Ambulatory Visit (INDEPENDENT_AMBULATORY_CARE_PROVIDER_SITE_OTHER)
Admission: RE | Admit: 2016-03-02 | Discharge: 2016-03-02 | Disposition: A | Discharge status: Home / Self Care | Payer: Medicare HMO | Source: Ambulatory Visit | Attending: Acute Care | Admitting: Acute Care

## 2016-03-02 ENCOUNTER — Telehealth: Payer: Self-pay | Admitting: Acute Care

## 2016-03-02 ENCOUNTER — Ambulatory Visit: Payer: BLUE CROSS/BLUE SHIELD | Admitting: Internal Medicine

## 2016-03-02 ENCOUNTER — Other Ambulatory Visit: Payer: Medicare HMO

## 2016-03-02 VITALS — BP 120/70 | HR 73 | Temp 98.4°F | Ht 69.0 in | Wt 249.4 lb

## 2016-03-02 DIAGNOSIS — R7989 Other specified abnormal findings of blood chemistry: Secondary | ICD-10-CM

## 2016-03-02 DIAGNOSIS — R0600 Dyspnea, unspecified: Secondary | ICD-10-CM | POA: Diagnosis not present

## 2016-03-02 DIAGNOSIS — R0602 Shortness of breath: Secondary | ICD-10-CM

## 2016-03-02 DIAGNOSIS — J45909 Unspecified asthma, uncomplicated: Secondary | ICD-10-CM

## 2016-03-02 DIAGNOSIS — R05 Cough: Secondary | ICD-10-CM

## 2016-03-02 DIAGNOSIS — R509 Fever, unspecified: Secondary | ICD-10-CM

## 2016-03-02 DIAGNOSIS — R059 Cough, unspecified: Secondary | ICD-10-CM

## 2016-03-02 LAB — D-DIMER, QUANTITATIVE (NOT AT ARMC): D DIMER QUANT: 0.72 ug{FEU}/mL — AB (ref <0.50)

## 2016-03-02 LAB — POCT INFLUENZA A/B
Influenza A, POC: NEGATIVE
Influenza B, POC: NEGATIVE

## 2016-03-02 LAB — NITRIC OXIDE: NITRIC OXIDE: 11

## 2016-03-02 MED ORDER — PREDNISONE 10 MG PO TABS: ORAL_TABLET | ORAL | 0 refills | Status: DC

## 2016-03-02 MED ORDER — PANTOPRAZOLE SODIUM 40 MG PO TBEC: 40.0000 mg | DELAYED_RELEASE_TABLET | Freq: Every day | ORAL | 3 refills | Status: DC

## 2016-03-02 MED ORDER — DOXYCYCLINE HYCLATE 100 MG PO TABS: 100.0000 mg | ORAL_TABLET | Freq: Two times a day (BID) | ORAL | 0 refills | Status: DC

## 2016-03-02 MED ORDER — MAGIC MOUTHWASH: 5.0000 mL | Freq: Three times a day (TID) | ORAL | 0 refills | Status: DC

## 2016-03-02 NOTE — Telephone Encounter (Signed)
Melissa [facility]ist with [facility] [place] called with questions about the magic mouthwash Rx Per Melissa they are unable to provide [facility] MMW and need to know what Judson Roch would like in the Rx Spoke with SG - she would like "just the plain [facility] MMW" Spoke with Melissa, she is unable to dispense a plain [facility] MMW and needs the ingredients Per Epic, [facility] MMW consists of generic Benadryl, hydrocortisone and nystatin.   Per Lenna Sciara, they cannot do the hydrocortisone and asked if dexamethasone is okay Spoke with SG - dexamethasone is okay as long as patient does NOT have a prednisone allergy Verified in patient's chart his only documented drug allergy is to ace inhibitors Gave verbal to [place] for [facility] MMW with benadryl, dexamethasone and nystatin with 1:1:1 ratios  Nothing further needed Will sign off

## 2016-03-02 NOTE — Assessment & Plan Note (Signed)
Cough, worse when lying down Plan: Protonix 40 before breakfast daily as therapeutic trial Pepcid 20 mg at bedtime as therapeutic trial GERD diet. Follow up in 2 weeks Please contact office for sooner follow up if symptoms do not improve or worsen or seek emergency care

## 2016-03-02 NOTE — Progress Notes (Signed)
History of Present Illness Vincent Mckay is a 66 y.o. male  with a history of asthma, allergic rhinitis. He is followed by Dr. Annamaria Boots.   03/02/2016 Acute OV: Pt. Presents for acute visit. He states that 10 days ago he developed a cough with fever, and diarrhea x 3 days. He did not seek medical attention. He was essentially bed bound for about 1 week.The fever and diarrhea has improved slowly, but he has continued to feel weak and unwell.Marland Kitchen He was able to eat dinner last night, but he developed the cough again Sunday afternoon 03/01/16.. It flared badly last night. He has been using Mucinex with no relief. His cough is non-productive. He has lower abdominal / groin pain with cough..He denies fever, but feels warm in the office today. He has no appetite and is not eating or drinking.He has been off his feet x 10 days with viral type symtoms.He appears dehydrated.He denies chest pain, orthopnea or hemoptysis. He denies leg or calf pain, or edema.   Tests FENO: 03/02/2016>> 11 lInfluenza PCR  03/02/2016 >>> Negative D-Dimer >>0.72 CXR 03/02/2016>>IMPRESSION: Chronic bronchitic -asthmatic interstitial changes. No pneumonia nor CHF. Thoracic aortic atherosclerosis.  Past medical hx Past Medical History:  Diagnosis Date  . ASTHMA, UNSPECIFIED, UNSPECIFIED STATUS 10/05/2008  . Chronic rhinitis 11/15/2008  . Cough 11/15/2008  . HAY FEVER 10/22/2006  . Hyperglycemia 09/12/2015  . HYPERLIPIDEMIA 12/01/2007  . HYPERTENSION 03/06/2008  . JAUNDICE 10/22/2006  . SINUSITIS- ACUTE-NOS 12/01/2007     Past surgical hx, Family hx, Social hx all reviewed.  Current Outpatient Prescriptions on File Prior to Visit  Medication Sig  . atorvastatin (LIPITOR) 10 MG tablet TAKE 1 TABLET EVERY DAY AT 6PM  . co-enzyme Q-10 50 MG capsule Take 50 mg by mouth daily.    . Ginkgo 60 MG TABS Take 1 tablet by mouth daily.    Marland Kitchen olmesartan-hydrochlorothiazide (BENICAR HCT) 20-12.5 MG tablet Take 1 tablet by mouth daily.  Marland Kitchen  PROAIR HFA 108 (90 BASE) MCG/ACT inhaler INHALE 2 PUFFS INTO THE LUNGS EVERY 6 HOURS AS NEEDED FOR WHEEZING OR SHORTNESS OF BREATH  . Red Yeast Rice Extract (RED YEAST RICE PO) Take 1 tablet by mouth daily.    . SOOLANTRA 1 % CREA Use as directed  . vitamin C (ASCORBIC ACID) 500 MG tablet Take 500 mg by mouth daily.    . beclomethasone (QVAR) 40 MCG/ACT inhaler Inhale 2 puffs into the lungs 2 (two) times daily. Rinse two times a day  . fluticasone (FLONASE) 50 MCG/ACT nasal spray 2 sprays each nostril once daily   No current facility-administered medications on file prior to visit.      Allergies  Allergen Reactions  . Ace Inhibitors     REACTION: Cough    Review Of Systems:  Constitutional:   +  weight loss, no night sweats,  ? Fevers, no chills,+  fatigue, or  lassitude.  HEENT:   No headaches,  Difficulty swallowing,  Tooth/dental problems, or  Sore throat,                No sneezing, itching, ear ache, nasal congestion,+ post nasal drip, MM dry  CV:  No chest pain,  Orthopnea, PND, swelling in lower extremities, anasarca, dizziness, palpitations, syncope.   GI  No heartburn, indigestion, abdominal pain, nausea, vomiting, diarrhea, change in bowel habits,+ loss of appetite, no bloody stools.   Resp: + shortness of breath with exertion less at rest.  No excess mucus, no productive  cough,  + non-productive cough,  No coughing up of blood.  No change in color of mucus.  + wheezing.  No chest wall deformity  Skin: no rash or lesions.  GU: no dysuria, change in color of urine, no urgency or frequency.  No flank pain, no hematuria   MS:  No joint pain or swelling.  No decreased range of motion.  No back pain.  Psych:  No change in mood or affect. No depression or anxiety.  No memory loss.   Vital Signs BP 120/70   Pulse 73   Temp 98.4 F (36.9 C) (Oral)   Ht 5\' 9"  (1.753 m)   Wt 249 lb 6.4 oz (113.1 kg)   SpO2 93%   BMI 36.83 kg/m    Physical Exam:  General- No  distress,  A&Ox3, pleasant, clearly does not feel well. ENT: No sinus tenderness, TM clear, pale nasal mucosa, no oral exudate,no post nasal drip, no LAN, oral thrush Cardiac: S1, S2, regular rate and rhythm, no murmur Chest: + wheeze/ no rales/ dullness; no accessory muscle use, no nasal flaring, no sternal retractions Abd.: Soft Non-tender, obese Ext: No clubbing cyanosis, edema Neuro:  normal strength, Skin: No rashes, warm and dry Psych: normal mood and behavior,appropriate   Assessment/Plan  Asthma Mild Flare post viral illness Flu PCR negative Plan: We will check a CXR today. Labs today: D dimer  We will call with results. We will do a flu swab We will do a FENO today Magic Mouthwash 5 cc's 3 times daily x 7 days. Continue your QVAR 1 puff daily as you have been doing. This is your maintenance inhaler Rinse mouth after use. Pro Air is your rescue inhaler. Use this 4-6 hours as needed for shortness of breath of wheezing. This is your rescue medication Continue to use you Flonase daily as you have been doing. Add Claritin 10 mg daily  after you are better. Prednisone taper; 10 mg tablets: 4 tabs x 2 days, 3 tabs x 2 days, 2 tabs x 2 days 1 tab x 2 days then stop. Doxycycline 100 mg twice daily x 7 days. Protonix 40 mg before breakfast daily Pepcid 20 mg at bedtime. Follow up in 2 weeks with Eric Form, NP or Dr. Annamaria Boots Please contact office for sooner follow up if symptoms do not improve or worsen or seek emergency care     Dyspnea Dyspnea without productive cough after viral illness. Positive non-productive cough Decreased activity with 10 day viral illness. Elevated D-dimer Plan CT Chest Angio BMET Follow up in 2 weeks with Eric Form, or Dr. Annamaria Boots. Please contact office for sooner follow up if symptoms do not improve or worsen or seek emergency care   Cough Cough, worse when lying down Plan: Protonix 40 before breakfast daily as therapeutic trial Pepcid  20 mg at bedtime as therapeutic trial GERD diet. Follow up in 2 weeks Please contact office for sooner follow up if symptoms do not improve or worsen or seek emergency care     Magdalen Spatz, NP 03/02/2016  7:26 PM

## 2016-03-02 NOTE — Telephone Encounter (Signed)
Received a call from [facility]. Pt's D-Dimer is back and is 0.72. Message will be routed to [facility].

## 2016-03-02 NOTE — Assessment & Plan Note (Signed)
Mild Flare post viral illness Flu PCR negative Plan: We will check a CXR today. Labs today: D dimer  We will call with results. We will do a flu swab We will do a FENO today Magic Mouthwash 5 cc's 3 times daily x 7 days. Continue your QVAR 1 puff daily as you have been doing. This is your maintenance inhaler Rinse mouth after use. Pro Air is your rescue inhaler. Use this 4-6 hours as needed for shortness of breath of wheezing. This is your rescue medication Continue to use you Flonase daily as you have been doing. Add Claritin 10 mg daily  after you are better. Prednisone taper; 10 mg tablets: 4 tabs x 2 days, 3 tabs x 2 days, 2 tabs x 2 days 1 tab x 2 days then stop. Doxycycline 100 mg twice daily x 7 days. Protonix 40 mg before breakfast daily Pepcid 20 mg at bedtime. Follow up in 2 weeks with Eric Form, NP or Dr. Annamaria Boots Please contact office for sooner follow up if symptoms do not improve or worsen or seek emergency care

## 2016-03-02 NOTE — Patient Instructions (Addendum)
It is nice to meet you. We will check a CXR today. Labs today: D dimer  We will call with results. We will do a flu swab We will do a FENO today Magic Mouthwash 5 cc's 3 times daily x 7 days. Continue your QVAR 1 puff daily as you have been doing. This is your maintenance inhaler Rinse mouth after use. Pro Air is your rescue inhaler. Use this 4-6 hours as needed for shortness of breath of wheezing. This is your rescue medication Continue to use you Flonase daily as you have been doing. Add Claritin 10 mg daily  after you are better. Prednisone taper; 10 mg tablets: 4 tabs x 2 days, 3 tabs x 2 days, 2 tabs x 2 days 1 tab x 2 days then stop. Doxycycline 100 mg twice daily x 7 days. Protonix 40 mg before breakfast daily Pepcid 20 mg at bedtime. Follow up in 2 weeks with Eric Form, NP or Dr. Annamaria Boots Please contact office for sooner follow up if symptoms do not improve or worsen or seek emergency care

## 2016-03-02 NOTE — Assessment & Plan Note (Signed)
Dyspnea without productive cough after viral illness. Positive non-productive cough Decreased activity with 10 day viral illness. Elevated D-dimer Plan CT Chest Angio BMET Follow up in 2 weeks with Eric Form, or Dr. Annamaria Boots. Please contact office for sooner follow up if symptoms do not improve or worsen or seek emergency care

## 2016-03-02 NOTE — Telephone Encounter (Signed)
Per SG >> please order CT angio.  Spoke with pt. He is aware of results of his D-Dimer. Order has been placed for CT angio and BMET. Nothing further was needed.

## 2016-03-03 ENCOUNTER — Other Ambulatory Visit (INDEPENDENT_AMBULATORY_CARE_PROVIDER_SITE_OTHER): Payer: Medicare HMO

## 2016-03-03 ENCOUNTER — Telehealth: Payer: Self-pay | Admitting: Acute Care

## 2016-03-03 ENCOUNTER — Ambulatory Visit (HOSPITAL_BASED_OUTPATIENT_CLINIC_OR_DEPARTMENT_OTHER)
Admission: RE | Admit: 2016-03-03 | Discharge: 2016-03-03 | Disposition: A | Discharge status: Home / Self Care | Payer: Medicare HMO | Source: Ambulatory Visit | Attending: Acute Care | Admitting: Acute Care

## 2016-03-03 ENCOUNTER — Encounter (HOSPITAL_BASED_OUTPATIENT_CLINIC_OR_DEPARTMENT_OTHER): Payer: Self-pay

## 2016-03-03 DIAGNOSIS — R7989 Other specified abnormal findings of blood chemistry: Secondary | ICD-10-CM

## 2016-03-03 DIAGNOSIS — I2510 Atherosclerotic heart disease of native coronary artery without angina pectoris: Secondary | ICD-10-CM | POA: Insufficient documentation

## 2016-03-03 HISTORY — DX: Unspecified asthma, uncomplicated: J45.909

## 2016-03-03 LAB — BASIC METABOLIC PANEL
BUN: 29 mg/dL — ABNORMAL HIGH (ref 6–23)
CHLORIDE: 104 meq/L (ref 96–112)
CO2: 22 mEq/L (ref 19–32)
Calcium: 9.3 mg/dL (ref 8.4–10.5)
Creatinine, Ser: 1.17 mg/dL (ref 0.40–1.50)
GFR: 66.27 mL/min (ref >60.00)
Glucose, Bld: 121 mg/dL — ABNORMAL HIGH (ref 70–99)
Potassium: 3.4 mEq/L — ABNORMAL LOW (ref 3.5–5.1)
Sodium: 137 mEq/L (ref 135–145)

## 2016-03-03 MED ORDER — IOPAMIDOL (ISOVUE-370) INJECTION 76%
100.0000 mL | Freq: Once | INTRAVENOUS | Status: AC | PRN
  Administered 2016-03-03: 100 mL via INTRAVENOUS

## 2016-03-03 MED ORDER — HYDROCODONE-HOMATROPINE 5-1.5 MG/5ML PO SYRP: 5.0000 mL | ORAL_SOLUTION | Freq: Four times a day (QID) | ORAL | 0 refills | Status: DC | PRN

## 2016-03-03 NOTE — Telephone Encounter (Signed)
Spoke with pt. States that he was seen by SG yesterday. He was prescribed prednisone and doxy. Pt states that he though SG was going to be giving him something to help with his cough. He is not able to sleep at night due feeling like he is drowning when he lays down.  Spoke with SG over the phone. Per SG >> pt can have Hydromet 1 tsp q6h prn.  CY - would you be willing to sign this rx as SG is working the hospital this morning.

## 2016-03-03 NOTE — Telephone Encounter (Signed)
Patient in the lobby - was here getting labs - would like to get rx while he is here - pt

## 2016-03-03 NOTE — Telephone Encounter (Signed)
Spoke with [employer]. Rx has been printed for CY to sign. Joellen Jersey also wanted pt's OV follow to be changed to see CY instead of SG. OV has been moved to 03/17/16 at 11:30am. Lesleigh Noe is taking this prescription to the pt and making him aware that his appointment has changed. Nothing further was needed.

## 2016-03-10 ENCOUNTER — Telehealth: Payer: Self-pay | Admitting: Internal Medicine

## 2016-03-10 NOTE — Telephone Encounter (Signed)
Spoke with pt's daughter, Benjamine Mola. Advised her that we do not have her listed on his HIPAA form. She called the pt on a three way call. Pt states that he is having pain in his abdomen from where he has been coughing so much. The pain is near his belt line and the abdomen is bruised. Coughing has subsided since his last OV though. Pt has given [place] verbal permission to speak to his daughter until he can update his DPR at his next OV. He has an upcoming appointment with CY on 03/17/2016.  CY - please advise.  Allergies  Allergen Reactions  . Ace Inhibitors     REACTION: Cough   Current Outpatient Prescriptions on File Prior to Visit  Medication Sig Dispense Refill  . atorvastatin (LIPITOR) 10 MG tablet TAKE 1 TABLET EVERY DAY AT 6PM 90 tablet 3  . beclomethasone (QVAR) 40 MCG/ACT inhaler Inhale 2 puffs into the lungs 2 (two) times daily. Rinse two times a day 3 Inhaler 3  . co-enzyme Q-10 50 MG capsule Take 50 mg by mouth daily.      Marland Kitchen doxycycline (VIBRA-TABS) 100 MG tablet Take 1 tablet (100 mg total) by mouth 2 (two) times daily. 14 tablet 0  . fluticasone (FLONASE) 50 MCG/ACT nasal spray 2 sprays each nostril once daily 48 g 3  . Ginkgo 60 MG TABS Take 1 tablet by mouth daily.      Marland Kitchen HYDROcodone-homatropine (HYCODAN) 5-1.5 MG/5ML syrup Take 5 mLs by mouth every 6 (six) hours as needed for cough. 140 mL 0  . magic mouthwash SOLN Take 5 mLs by mouth 3 (three) times daily. 120 mL 0  . olmesartan-hydrochlorothiazide (BENICAR HCT) 20-12.5 MG tablet Take 1 tablet by mouth daily. 90 tablet 3  . pantoprazole (PROTONIX) 40 MG tablet Take 1 tablet (40 mg total) by mouth daily. 30 tablet 3  . predniSONE (DELTASONE) 10 MG tablet Take 4 tabs x2 days, 3 tabs x2 days, 2 tabs x 2 days, and 1 tab x 2 days. 20 tablet 0  . PROAIR HFA 108 (90 BASE) MCG/ACT inhaler INHALE 2 PUFFS INTO THE LUNGS EVERY 6 HOURS AS NEEDED FOR WHEEZING OR SHORTNESS OF BREATH 25.5 Inhaler 1  . Red Yeast Rice Extract (RED YEAST  RICE PO) Take 1 tablet by mouth daily.      . SOOLANTRA 1 % CREA Use as directed    . vitamin C (ASCORBIC ACID) 500 MG tablet Take 500 mg by mouth daily.       No current facility-administered medications on file prior to visit.

## 2016-03-10 NOTE — Telephone Encounter (Signed)
Spoke with pt's daughter, Benjamine Mola. She is aware of CY's recommendation. States that pt is already on a cough medication. They will just continue with Ibuprofen for the pain and keep his appointment on 03/17/16. Nothing further was needed.

## 2016-03-10 NOTE — Telephone Encounter (Signed)
Main problem seems to be cough. Offer prometh codeine cough syrup  140 ml,   5 ml every 6 hours if needed for cough

## 2016-03-17 ENCOUNTER — Ambulatory Visit (INDEPENDENT_AMBULATORY_CARE_PROVIDER_SITE_OTHER): Payer: Medicare HMO | Admitting: Internal Medicine

## 2016-03-17 ENCOUNTER — Encounter: Payer: Self-pay | Admitting: *Deleted

## 2016-03-17 ENCOUNTER — Encounter: Payer: Self-pay | Admitting: Internal Medicine

## 2016-03-17 DIAGNOSIS — J4521 Mild intermittent asthma with (acute) exacerbation: Secondary | ICD-10-CM

## 2016-03-17 DIAGNOSIS — R05 Cough: Secondary | ICD-10-CM | POA: Diagnosis not present

## 2016-03-17 DIAGNOSIS — R059 Cough, unspecified: Secondary | ICD-10-CM

## 2016-03-17 DIAGNOSIS — IMO0001 Reserved for inherently not codable concepts without codable children: Secondary | ICD-10-CM

## 2016-03-17 MED ORDER — FLUTICASONE PROPIONATE 50 MCG/ACT NA SUSP: NASAL | 3 refills | Status: DC

## 2016-03-17 MED ORDER — BECLOMETHASONE DIPROP HFA 40 MCG/ACT IN AERB: 1.0000 | INHALATION_SPRAY | Freq: Two times a day (BID) | RESPIRATORY_TRACT | 3 refills | Status: DC

## 2016-03-17 MED ORDER — ALBUTEROL SULFATE HFA 108 (90 BASE) MCG/ACT IN AERS: INHALATION_SPRAY | RESPIRATORY_TRACT | 3 refills | Status: DC

## 2016-03-17 NOTE — Patient Instructions (Signed)
Scripts for refills sent.   If your insurance won't cover the new Qvar device, then we will need your help to check your insurance formulary to see what they prefer in the inhaled steroid category.  Please call if we can help

## 2016-03-17 NOTE — Progress Notes (Signed)
HPI male never smoker followed for Asthma, allergic rhinitis, complicated by HBP, ASCVD Office spirometry 12/25/08  -----------------------------------------------------------------------------------------------------  10/03/13- 63 yoM never smoker followed for hx asthma, allergic rhinitis FOLLOWS FOR: no complaints; just here for yearly follow up and get refills needed.  Please print all Rx's.  03/18/[birth year]11-66 year old male never smoker followed for Asthma, allergic rhinitis, complicated by HBP, ASCVD LOV NP 03/02/16- 4 post viral bronchitis, cough. Ordered CT scan, treated with Protonix and Pepcid/GERD diet. Ddimer 0.72 FOLLOWS FOR: Pt states he feels much better since being seen 03-02-16 with SG. Pt has noticed brusing on his stomach area from coughing so hard-would like ot be checked.  Cough significantly improved since last ov. Says Hycodan cough syrup was not strong enough and he remembers Tussionex working better, for future reference. Shows pictures of periumbilical bruising attributed to hard cough. Upper abdomen area included on CT of chest was negative except for atherosclerosis. CTa chest 03/03/16- IMPRESSION: 1. No evidence of pulmonary emboli or other acute abnormality in the chest. 2. Aortic and coronary artery atherosclerosis. These results will be called to the ordering clinician or representative by the Radiology Department at the imaging location  ROS-see HPI Constitutional:   No-   weight loss, night sweats, fevers, chills, fatigue, lassitude. HEENT:   No-  headaches, difficulty swallowing, tooth/dental problems, sore throat,       No-  sneezing, itching, ear ache, nasal congestion, post nasal drip,  CV:  No-   chest pain, orthopnea, PND, swelling in lower extremities, anasarca, dizziness, palpitations Resp: No-   shortness of breath with exertion or at rest.              No-   productive cough,  No non-productive cough,  No- coughing up of blood.              No-   change in  color of mucus.  No- wheezing.   Skin: No-   rash or lesions. GI:  No-   heartburn, indigestion, abdominal pain, nausea, vomiting,  GU:  MS:  No-   joint pain or swelling.  . Neuro-     nothing unusual Psych:  No- change in mood or affect. No depression or anxiety.  No memory loss.  OBJ- Physical Exam General- Alert, Oriented, Affect-appropriate, Distress- none acute, overweight Skin- rash-none, lesions- none, excoriation- none Lymphadenopathy- none Head- atraumatic            Eyes- Gross vision intact, PERRLA, conjunctivae and secretions clear            Ears- Hearing, canals-normal            Nose- Clear, no-Septal dev, mucus, polyps, erosion, perforation             Throat- Mallampati II , mucosa clear , drainage- none, tonsils- atrophic Neck- flexible , trachea midline, no stridor , thyroid nl, carotid no bruit Chest - symmetrical excursion , unlabored           Heart/CV- RRR , no murmur , no gallop  , no rub, nl s1 s2                           - JVD- none , edema- none, stasis changes- none, varices- none           Lung- clear to P&A, wheeze- none, cough- none , dullness-none, rub- none           Chest wall-  Abd- +  obese with faint residual periumbilical bruising but no palpable ventral hernia. Br/ Gen/ Rectal- Not done, not indicated Extrem- cyanosis- none, clubbing, none, atrophy- none, strength- nl Neuro- grossly intact to observation

## 2016-03-22 NOTE — Assessment & Plan Note (Addendum)
Recently resolved and acute asthmatic bronchitis associated with very hard coughing and Peri- umbilical tussive bruising. Refilling Qvar with the new device. If his insurance doesn't cover that we will changing to a different steroid inhaler.

## 2016-03-22 NOTE — Assessment & Plan Note (Signed)
He would like [place] to remember that Tussionex may be the best choice as an antitussive if he has a future event like his recent acute episode

## 2016-03-25 ENCOUNTER — Ambulatory Visit: Payer: Medicare HMO | Admitting: Acute Care

## 2016-03-25 DIAGNOSIS — L718 Other rosacea: Secondary | ICD-10-CM | POA: Diagnosis not present

## 2016-03-25 DIAGNOSIS — L570 Actinic keratosis: Secondary | ICD-10-CM | POA: Diagnosis not present

## 2016-03-26 ENCOUNTER — Telehealth: Payer: Self-pay | Admitting: Internal Medicine

## 2016-03-26 MED ORDER — HYDROCOD POLST-CPM POLST ER 10-8 MG/5ML PO SUER: 5.0000 mL | Freq: Two times a day (BID) | ORAL | 0 refills | Status: DC | PRN

## 2016-03-26 MED ORDER — FLUTICASONE PROPIONATE HFA 110 MCG/ACT IN AERO: 2.0000 | INHALATION_SPRAY | Freq: Two times a day (BID) | RESPIRATORY_TRACT | 12 refills | Status: DC

## 2016-03-26 NOTE — Telephone Encounter (Signed)
Dr. Annamaria Boots brought in the printed phone note and stated he forgot to add that we could prescribe the tussionex. Rx was printed and placed on CY's cart. Spoke with pt about CY's message and pt is aware that rx will be placed up front for pick up once he has signed it. New inhaler was sent in as well.

## 2016-03-26 NOTE — Telephone Encounter (Signed)
Change Qvar tro Flovent 110, # 1, inhale 2 puffs then rinse mouth, twice daily, refill x 12

## 2016-03-26 NOTE — Telephone Encounter (Signed)
Dr. Annamaria Boots,  Please Advise-  Pt is c/o lots of coughing it does not seem to be improving and he states when he saw you on 03/17/16 you mentioned prescribing Tussionex syrup and wanted to know if he could get a rx of this. Also he stated that the Qvar Redihaler is going to cost him $140. Encouraged pt to reach out to his insurance for a more affordable option but he stated he wanted to see if you were just willing to prescribe something else.

## 2016-03-26 NOTE — Telephone Encounter (Signed)
Rx has been signed and has been placed up front for pick up. Nothing further was needed.

## 2016-05-27 DIAGNOSIS — L570 Actinic keratosis: Secondary | ICD-10-CM | POA: Diagnosis not present

## 2016-09-25 ENCOUNTER — Other Ambulatory Visit (INDEPENDENT_AMBULATORY_CARE_PROVIDER_SITE_OTHER): Payer: Medicare HMO

## 2016-09-25 ENCOUNTER — Other Ambulatory Visit: Payer: Self-pay | Admitting: Internal Medicine

## 2016-09-25 ENCOUNTER — Encounter: Payer: Self-pay | Admitting: Internal Medicine

## 2016-09-25 ENCOUNTER — Ambulatory Visit (INDEPENDENT_AMBULATORY_CARE_PROVIDER_SITE_OTHER): Payer: Medicare HMO | Admitting: Internal Medicine

## 2016-09-25 VITALS — BP 138/76 | HR 67 | Temp 98.1°F | Ht 69.0 in | Wt 255.0 lb

## 2016-09-25 DIAGNOSIS — Z0000 Encounter for general adult medical examination without abnormal findings: Secondary | ICD-10-CM

## 2016-09-25 DIAGNOSIS — Z125 Encounter for screening for malignant neoplasm of prostate: Secondary | ICD-10-CM

## 2016-09-25 DIAGNOSIS — R972 Elevated prostate specific antigen [PSA]: Secondary | ICD-10-CM

## 2016-09-25 DIAGNOSIS — L989 Disorder of the skin and subcutaneous tissue, unspecified: Secondary | ICD-10-CM | POA: Diagnosis not present

## 2016-09-25 DIAGNOSIS — R739 Hyperglycemia, unspecified: Secondary | ICD-10-CM | POA: Diagnosis not present

## 2016-09-25 DIAGNOSIS — Z23 Encounter for immunization: Secondary | ICD-10-CM | POA: Diagnosis not present

## 2016-09-25 LAB — LIPID PANEL
CHOLESTEROL: 122 mg/dL (ref 0–200)
HDL: 42.2 mg/dL (ref >39.00)
LDL CALC: 46 mg/dL (ref 0–99)
NonHDL: 79.9
TRIGLYCERIDES: 172 mg/dL — AB (ref 0.0–149.0)
Total CHOL/HDL Ratio: 3
VLDL: 34.4 mg/dL (ref 0.0–40.0)

## 2016-09-25 LAB — URINALYSIS, ROUTINE W REFLEX MICROSCOPIC
Bilirubin Urine: NEGATIVE
Hgb urine dipstick: NEGATIVE
KETONES UR: NEGATIVE
LEUKOCYTES UA: NEGATIVE
NITRITE: NEGATIVE
RBC / HPF: NONE SEEN (ref 0–?)
SPECIFIC GRAVITY, URINE: 1.025 (ref 1.000–1.030)
TOTAL PROTEIN, URINE-UPE24: NEGATIVE
URINE GLUCOSE: NEGATIVE
Urobilinogen, UA: 0.2 (ref 0.0–1.0)
pH: 5.5 (ref 5.0–8.0)

## 2016-09-25 LAB — HEPATIC FUNCTION PANEL
ALBUMIN: 4.4 g/dL (ref 3.5–5.2)
ALT: 33 U/L (ref 0–53)
AST: 37 U/L (ref 0–37)
Alkaline Phosphatase: 44 U/L (ref 39–117)
Bilirubin, Direct: 0.2 mg/dL (ref 0.0–0.3)
Total Bilirubin: 0.6 mg/dL (ref 0.2–1.2)
Total Protein: 6.5 g/dL (ref 6.0–8.3)

## 2016-09-25 LAB — CBC WITH DIFFERENTIAL/PLATELET
Basophils Absolute: 0 10*3/uL (ref 0.0–0.1)
Basophils Relative: 0.4 % (ref 0.0–3.0)
EOS PCT: 3.5 % (ref 0.0–5.0)
Eosinophils Absolute: 0.3 10*3/uL (ref 0.0–0.7)
HCT: 38.6 % — ABNORMAL LOW (ref 39.0–52.0)
HEMOGLOBIN: 13.4 g/dL (ref 13.0–17.0)
Lymphocytes Relative: 16.6 % (ref 12.0–46.0)
Lymphs Abs: 1.3 10*3/uL (ref 0.7–4.0)
MCHC: 34.8 g/dL (ref 30.0–36.0)
MCV: 86.3 fl (ref 78.0–100.0)
MONOS PCT: 11.8 % (ref 3.0–12.0)
Monocytes Absolute: 0.9 10*3/uL (ref 0.1–1.0)
Neutro Abs: 5.4 10*3/uL (ref 1.4–7.7)
Neutrophils Relative %: 67.7 % (ref 43.0–77.0)
Platelets: 219 10*3/uL (ref 150.0–400.0)
RBC: 4.47 Mil/uL (ref 4.22–5.81)
RDW: 13.3 % (ref 11.5–15.5)
WBC: 8 10*3/uL (ref 4.0–10.5)

## 2016-09-25 LAB — HEMOGLOBIN A1C: Hgb A1c MFr Bld: 5.8 % (ref 4.6–6.5)

## 2016-09-25 LAB — BASIC METABOLIC PANEL
BUN: 46 mg/dL — AB (ref 6–23)
CALCIUM: 9.3 mg/dL (ref 8.4–10.5)
CO2: 21 mEq/L (ref 19–32)
Chloride: 108 mEq/L (ref 96–112)
Creatinine, Ser: 1.48 mg/dL (ref 0.40–1.50)
GFR: 50.44 mL/min — AB (ref >60.00)
GLUCOSE: 98 mg/dL (ref 70–99)
Potassium: 3.9 mEq/L (ref 3.5–5.1)
Sodium: 139 mEq/L (ref 135–145)

## 2016-09-25 LAB — PSA: PSA: 2.48 ng/mL (ref 0.10–4.00)

## 2016-09-25 LAB — TSH: TSH: 1.96 u[IU]/mL (ref 0.35–4.50)

## 2016-09-25 MED ORDER — ASPIRIN 81 MG PO TBEC: 81.0000 mg | DELAYED_RELEASE_TABLET | Freq: Every day | ORAL | 12 refills | Status: AC

## 2016-09-25 MED ORDER — OLMESARTAN MEDOXOMIL-HCTZ 20-12.5 MG PO TABS: 1.0000 | ORAL_TABLET | Freq: Every day | ORAL | 3 refills | Status: DC

## 2016-09-25 MED ORDER — ATORVASTATIN CALCIUM 10 MG PO TABS: ORAL_TABLET | ORAL | 3 refills | Status: DC

## 2016-09-25 NOTE — Assessment & Plan Note (Signed)
Asympt, for a1c with labs 

## 2016-09-25 NOTE — Assessment & Plan Note (Signed)
For derm referral as requested

## 2016-09-25 NOTE — Patient Instructions (Addendum)
Please call if you change your mind about the flu shot and colonoscopy  You had the Pneumovax pneumonia shot  Please continue all other medications as before, and refills have been done if requested.  Please have the pharmacy call with any other refills you may need.  Please continue your efforts at being more active, low cholesterol diet, and weight control.  You are otherwise up to date with prevention measures today.  Please keep your appointments with your specialists as you may have planned  .Your lab work was done earlier today  You will be contacted by phone if any changes need to be made immediately.  Otherwise, you will receive a letter about your results with an explanation, but please check with MyChart first.  Please remember to sign up for MyChart if you have not done so, as this will be important to you in the future with finding out test results, communicating by private email, and scheduling acute appointments online when needed.  Please return in 1 year for your yearly visit, or sooner if needed, with Lab testing done 3-5 days before

## 2016-09-25 NOTE — Progress Notes (Signed)
Subjective:    Patient ID: Vincent Mckay, male    DOB: April 09, 1950, 66 y.o.   MRN: [id number]  HPI  Here for wellness and f/u;  Overall doing ok;  Pt denies Chest pain, worsening SOB, DOE, wheezing, orthopnea, PND, worsening LE edema, palpitations, dizziness or syncope.  Pt denies neurological change such as new headache, facial or extremity weakness.  Pt denies polydipsia, polyuria, or low sugar symptoms. Pt states overall good compliance with treatment and medications, good tolerability, and has been trying to follow appropriate diet.  Pt denies worsening depressive symptoms, suicidal ideation or panic. No fever, night sweats, wt loss, loss of appetite, or other constitutional symptoms.  Pt states good ability with ADL's, has low fall risk, home safety reviewed and adequate, no other significant changes in hearing or vision, and only occasionally active with exercise.  Declines flu shot.  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  No other interval hx  Does ask for derm referral for f/u as last derm has left practice  Denies any specific worsening lesion but needs yearly complete exam  Declines flu shot and colonoscopy Past Medical History:  Diagnosis Date  . Asthma   . ASTHMA, UNSPECIFIED, UNSPECIFIED STATUS 10/05/2008  . Chronic rhinitis 11/15/2008  . Cough 11/15/2008  . HAY FEVER 10/22/2006  . Hyperglycemia 09/12/2015  . HYPERLIPIDEMIA 12/01/2007  . HYPERTENSION 03/06/2008  . JAUNDICE 10/22/2006  . SINUSITIS- ACUTE-NOS 12/01/2007   Past Surgical History:  Procedure Laterality Date  . FINGER GANGLION CYST EXCISION     right index  . s/p left bicep tendon rupture  2004  . TONSILLECTOMY      reports that he has never smoked. He has never used smokeless tobacco. He reports that he does not drink alcohol or use drugs. family history includes Arthritis in his other; Diabetes in his other. Allergies  Allergen Reactions  . Ace Inhibitors     REACTION: Cough    Current Outpatient Prescriptions on File Prior to Visit  Medication Sig Dispense Refill  . albuterol (PROAIR HFA) 108 (90 Base) MCG/ACT inhaler INHALE 2 PUFFS INTO THE LUNGS EVERY 6 HOURS AS NEEDED FOR WHEEZING OR SHORTNESS OF BREATH 3 Inhaler 3  . Beclomethasone Diprop HFA (QVAR REDIHALER) 40 MCG/ACT AERB Inhale 1 puff into the lungs 2 (two) times daily. Rinse mouth 3 Inhaler 3  . chlorpheniramine-HYDROcodone (TUSSIONEX PENNKINETIC ER) 10-8 MG/5ML SUER Take 5 mLs by mouth every 12 (twelve) hours as needed for cough. 200 mL 0  . co-enzyme Q-10 50 MG capsule Take 50 mg by mouth daily.      . fluticasone (FLONASE) 50 MCG/ACT nasal spray 2 sprays each nostril once daily 48 g 3  . fluticasone (FLOVENT HFA) 110 MCG/ACT inhaler Inhale 2 puffs into the lungs 2 (two) times daily. 1 Inhaler 12  . Ginkgo 60 MG TABS Take 1 tablet by mouth daily.      . magic mouthwash SOLN Take 5 mLs by mouth 3 (three) times daily. 120 mL 0  . Red Yeast Rice Extract (RED YEAST RICE PO) Take 1 tablet by mouth daily.      . SOOLANTRA 1 % CREA Use as directed    . vitamin C (ASCORBIC ACID) 500 MG tablet Take 500 mg by mouth daily.       No current facility-administered medications on file prior to visit.    Review of Systems Constitutional: Negative for other unusual diaphoresis, sweats, appetite  or weight changes HENT: Negative for other worsening hearing loss, ear pain, facial swelling, mouth sores or neck stiffness.   Eyes: Negative for other worsening pain, redness or other visual disturbance.  Respiratory: Negative for other stridor or swelling Cardiovascular: Negative for other palpitations or other chest pain  Gastrointestinal: Negative for worsening diarrhea or loose stools, blood in stool, distention or other pain Genitourinary: Negative for hematuria, flank pain or other change in urine volume.  Musculoskeletal: Negative for myalgias or other joint swelling.  Skin: Negative for other color change, or other  wound or worsening drainage.  Neurological: Negative for other syncope or numbness. Hematological: Negative for other adenopathy or swelling Psychiatric/Behavioral: Negative for hallucinations, other worsening agitation, SI, self-injury, or new decreased concentration All other system neg per pt    Objective:   Physical Exam BP 138/76   Pulse 67   Temp 98.1 F (36.7 C) (Oral)   Ht 5\' 9"  (1.753 m)   Wt 255 lb (115.7 kg)   SpO2 99%   BMI 37.66 kg/m  VS noted, morbid obese Constitutional: Pt is oriented to person, place, and time. Appears well-developed and well-nourished, in no significant distress and comfortable Head: Normocephalic and atraumatic  Eyes: Conjunctivae and EOM are normal. Pupils are equal, round, and reactive to light Right Ear: External ear normal without discharge Left Ear: External ear normal without discharge Nose: Nose without discharge or deformity Mouth/Throat: Oropharynx is without other ulcerations and moist  Neck: Normal range of motion. Neck supple. No JVD present. No tracheal deviation present or significant neck LA or mass Cardiovascular: Normal rate, regular rhythm, normal heart sounds and intact distal pulses.   Pulmonary/Chest: WOB normal and breath sounds without rales or wheezing  Abdominal: Soft. Bowel sounds are normal. NT. No HSM  Musculoskeletal: Normal range of motion. Exhibits no edema Lymphadenopathy: Has no other cervical adenopathy.  Neurological: Pt is alert and oriented to person, place, and time. Pt has normal reflexes. No cranial nerve deficit. Motor grossly intact, Gait intact Skin: Skin is warm and dry. No rash noted or new ulcerations Psychiatric:  Has irritable mood and affect. Behavior is normal without agitation No other exam findings Lab Results  Component Value Date   WBC 8.0 09/25/2016   HGB 13.4 09/25/2016   HCT 38.6 (L) 09/25/2016   PLT 219.0 09/25/2016   GLUCOSE 98 09/25/2016   CHOL 122 09/25/2016   TRIG 172.0 (H)  09/25/2016   HDL 42.20 09/25/2016   LDLDIRECT 161.8 05/16/2010   LDLCALC 46 09/25/2016   ALT 33 09/25/2016   AST 37 09/25/2016   NA 139 09/25/2016   K 3.9 09/25/2016   CL 108 09/25/2016   CREATININE 1.48 09/25/2016   BUN 46 (H) 09/25/2016   CO2 21 09/25/2016   TSH 1.96 09/25/2016   PSA 2.48 09/25/2016   HGBA1C 5.8 09/25/2016      Assessment & Plan:

## 2016-09-25 NOTE — Assessment & Plan Note (Signed)

## 2016-09-28 ENCOUNTER — Telehealth: Payer: Self-pay

## 2016-09-28 NOTE — Telephone Encounter (Signed)
Called pt, LVM.   

## 2016-09-28 NOTE — Telephone Encounter (Signed)
-----   Message from Biagio Borg, MD sent at 09/25/2016 12:02 PM EDT ----- Left message on MyChart, pt to cont same tx except  The test results show that your current treatment is OK, except the PSA is mildly increased over the last few years.  This may or may not be important, but we should check your PSA in 6 months (instead of 1 year) to keep tabs.  Please go to the LAB ONLY in 6 months and have this done.  If the PSA continues to rise quickly, this could be significant, and may need urology referral  Please be aware that there is no reminder call about this, as you should plan for this yourself on your calendar.   Shirron to please inform pt, I will do order

## 2016-10-01 ENCOUNTER — Encounter: Payer: Self-pay | Admitting: Internal Medicine

## 2016-10-01 ENCOUNTER — Other Ambulatory Visit: Payer: Self-pay | Admitting: Internal Medicine

## 2016-10-14 ENCOUNTER — Encounter: Payer: Self-pay | Admitting: Internal Medicine

## 2016-12-15 DIAGNOSIS — Z85828 Personal history of other malignant neoplasm of skin: Secondary | ICD-10-CM | POA: Diagnosis not present

## 2016-12-15 DIAGNOSIS — D225 Melanocytic nevi of trunk: Secondary | ICD-10-CM | POA: Diagnosis not present

## 2016-12-15 DIAGNOSIS — L570 Actinic keratosis: Secondary | ICD-10-CM | POA: Diagnosis not present

## 2016-12-15 DIAGNOSIS — L821 Other seborrheic keratosis: Secondary | ICD-10-CM | POA: Diagnosis not present

## 2016-12-15 DIAGNOSIS — D1801 Hemangioma of skin and subcutaneous tissue: Secondary | ICD-10-CM | POA: Diagnosis not present

## 2016-12-24 ENCOUNTER — Telehealth: Payer: Self-pay | Admitting: Internal Medicine

## 2016-12-24 NOTE — Telephone Encounter (Signed)
Rec'd from [facility] forward 7 pages to Dr. Cathlean Cower

## 2017-03-18 ENCOUNTER — Ambulatory Visit: Payer: Medicare HMO | Admitting: Internal Medicine

## 2017-03-19 ENCOUNTER — Telehealth: Payer: Self-pay | Admitting: Internal Medicine

## 2017-03-19 NOTE — Telephone Encounter (Signed)
Opened in error

## 2017-04-15 DIAGNOSIS — C4442 Squamous cell carcinoma of skin of scalp and neck: Secondary | ICD-10-CM | POA: Diagnosis not present

## 2017-04-15 DIAGNOSIS — D485 Neoplasm of uncertain behavior of skin: Secondary | ICD-10-CM | POA: Diagnosis not present

## 2017-04-15 DIAGNOSIS — L570 Actinic keratosis: Secondary | ICD-10-CM | POA: Diagnosis not present

## 2017-06-08 DIAGNOSIS — C4442 Squamous cell carcinoma of skin of scalp and neck: Secondary | ICD-10-CM | POA: Diagnosis not present

## 2017-06-23 ENCOUNTER — Ambulatory Visit: Payer: Medicare HMO | Admitting: Internal Medicine

## 2017-06-23 ENCOUNTER — Encounter: Payer: Self-pay | Admitting: Internal Medicine

## 2017-06-23 DIAGNOSIS — J4520 Mild intermittent asthma, uncomplicated: Secondary | ICD-10-CM | POA: Diagnosis not present

## 2017-06-23 DIAGNOSIS — IMO0001 Reserved for inherently not codable concepts without codable children: Secondary | ICD-10-CM

## 2017-06-23 MED ORDER — ALBUTEROL SULFATE HFA 108 (90 BASE) MCG/ACT IN AERS: INHALATION_SPRAY | RESPIRATORY_TRACT | 3 refills | Status: DC

## 2017-06-23 MED ORDER — HYDROCOD POLST-CPM POLST ER 10-8 MG/5ML PO SUER: 5.0000 mL | Freq: Two times a day (BID) | ORAL | 0 refills | Status: DC | PRN

## 2017-06-23 MED ORDER — BECLOMETHASONE DIPROP HFA 40 MCG/ACT IN AERB: 1.0000 | INHALATION_SPRAY | Freq: Two times a day (BID) | RESPIRATORY_TRACT | 3 refills | Status: DC

## 2017-06-23 NOTE — Patient Instructions (Signed)
Inhalers refilled- scripts sent  Script printed for Tussionex  Please call if we can help

## 2017-06-23 NOTE — Assessment & Plan Note (Signed)
Control has been good without sleep disturbance or significant exacerbation. Plan-continue current management with inhaled steroid and rescue inhaler.  Okay to fill Tussionex to hold for the winter-conservative use discussed.

## 2017-06-23 NOTE — Progress Notes (Signed)
HPI male never smoker followed for Asthma, allergic rhinitis, complicated by HBP, ASCVD Office spirometry 12/25/08  ------------------------------------------------------------------------------------  03/17/16-67 year old male never smoker followed for Asthma, allergic rhinitis, complicated by HBP, ASCVD LOV NP 03/02/16- 4 post viral bronchitis, cough. Ordered CT scan, treated with Protonix and Pepcid/GERD diet. Ddimer 0.72 FOLLOWS FOR: Pt states he feels much better since being seen 03-02-16 with SG. Pt has noticed brusing on his stomach area from coughing so hard-would like ot be checked.  Cough significantly improved since last ov. Says Hycodan cough syrup was not strong enough and he remembers Tussionex working better, for future reference. Shows pictures of periumbilical bruising attributed to hard cough. Upper abdomen area included on CT of chest was negative except for atherosclerosis. CTa chest 03/03/16- IMPRESSION: 1. No evidence of pulmonary emboli or other acute abnormality in the chest. 2. Aortic and coronary artery atherosclerosis. These results will be called to the ordering clinician or representative by the Radiology Department at the imaging location  06/23/2017- 67 year old male never smoker followed for Asthma, allergic rhinitis, complicated by HBP,  ASCVD ----Asthma: Pt denies any wheezing, major cough, or congestion since last visit.  Pro-air HFA, Qvar RediHaler 40, He is using 1 puff of Qvar and 1 puff of pro-air most days and feels very well controlled.  Denies sleep disturbance or any significant episodic flares since last here.  Seasonal Flonase. Has retired now from [employer] - less exposure now. Asks tussionex to hold for winter- discussed.   ROS-see HPI + = positive Constitutional:   No-   weight loss, night sweats, fevers, chills, fatigue, lassitude. HEENT:   No-  headaches, difficulty swallowing, tooth/dental problems, sore throat,       No-  sneezing, itching, ear  ache, nasal congestion, post nasal drip,  CV:  No-   chest pain, orthopnea, PND, swelling in lower extremities, anasarca, dizziness, palpitations Resp: No-   shortness of breath with exertion or at rest.              No-   productive cough,  No non-productive cough,  No- coughing up of blood.              No-   change in color of mucus.  No- wheezing.   Skin: No-   rash or lesions. GI:  No-   heartburn, indigestion, abdominal pain, nausea, vomiting,  GU:  MS:  No-   joint pain or swelling.  . Neuro-     nothing unusual Psych:  No- change in mood or affect. No depression or anxiety.  No memory loss.  OBJ- Physical Exam General- Alert, Oriented, Affect-appropriate, Distress- none acute, overweight Skin- rash-none, lesions- none, excoriation- none Lymphadenopathy- none Head- atraumatic            Eyes- Gross vision intact, PERRLA, conjunctivae and secretions clear            Ears- Hearing, canals-normal            Nose- Clear, no-Septal dev, mucus, polyps, erosion, perforation             Throat- Mallampati II , mucosa clear , drainage- none, tonsils- atrophic Neck- flexible , trachea midline, no stridor , thyroid nl, carotid no bruit Chest - symmetrical excursion , unlabored           Heart/CV- RRR , no murmur , no gallop  , no rub, nl s1 s2                           -  JVD- none , edema- none, stasis changes- none, varices- none           Lung- clear to P&A, wheeze- none, cough- none , dullness-none, rub- none           Chest wall-  Abd- + obese with faint residual periumbilical bruising but no palpable ventral hernia. Br/ Gen/ Rectal- Not done, not indicated Extrem- cyanosis- none, clubbing, none, atrophy- none, strength- nl Neuro- grossly intact to observation

## 2017-06-24 ENCOUNTER — Other Ambulatory Visit: Payer: Self-pay | Admitting: Internal Medicine

## 2017-06-28 ENCOUNTER — Telehealth: Payer: Self-pay | Admitting: Internal Medicine

## 2017-06-28 MED ORDER — ALBUTEROL SULFATE HFA 108 (90 BASE) MCG/ACT IN AERS: INHALATION_SPRAY | RESPIRATORY_TRACT | 3 refills | Status: DC

## 2017-06-28 MED ORDER — BECLOMETHASONE DIPROP HFA 40 MCG/ACT IN AERB: 1.0000 | INHALATION_SPRAY | Freq: Two times a day (BID) | RESPIRATORY_TRACT | 3 refills | Status: DC

## 2017-06-28 MED ORDER — FLUTICASONE PROPIONATE 50 MCG/ACT NA SUSP: NASAL | 3 refills | Status: DC

## 2017-06-28 NOTE — Telephone Encounter (Signed)
Spoke with pt, at his last OV he was to have refills of his Flonase, Proair, and Qvar sent to the pharmacy.  Flovent was sent instead, and was cancelled through pt's chart but the pharmacy was not contacted.  I apologized for the mishap, and have sent in appropriate refills for pt.  Reviewed proper medication use, pt expressed understanding.  Nothing further needed.

## 2017-07-26 DIAGNOSIS — Z85828 Personal history of other malignant neoplasm of skin: Secondary | ICD-10-CM | POA: Diagnosis not present

## 2017-07-26 DIAGNOSIS — L905 Scar conditions and fibrosis of skin: Secondary | ICD-10-CM | POA: Diagnosis not present

## 2017-07-26 DIAGNOSIS — L820 Inflamed seborrheic keratosis: Secondary | ICD-10-CM | POA: Diagnosis not present

## 2017-08-26 ENCOUNTER — Ambulatory Visit (INDEPENDENT_AMBULATORY_CARE_PROVIDER_SITE_OTHER): Payer: Medicare HMO | Admitting: Internal Medicine

## 2017-08-26 ENCOUNTER — Other Ambulatory Visit (INDEPENDENT_AMBULATORY_CARE_PROVIDER_SITE_OTHER): Payer: Medicare HMO

## 2017-08-26 ENCOUNTER — Encounter: Payer: Self-pay | Admitting: Internal Medicine

## 2017-08-26 ENCOUNTER — Telehealth: Payer: Self-pay | Admitting: Internal Medicine

## 2017-08-26 VITALS — BP 136/82 | HR 64 | Temp 98.4°F | Ht 69.0 in | Wt 239.0 lb

## 2017-08-26 DIAGNOSIS — R739 Hyperglycemia, unspecified: Secondary | ICD-10-CM | POA: Diagnosis not present

## 2017-08-26 DIAGNOSIS — Z0000 Encounter for general adult medical examination without abnormal findings: Secondary | ICD-10-CM | POA: Diagnosis not present

## 2017-08-26 LAB — CBC WITH DIFFERENTIAL/PLATELET
BASOS PCT: 0.5 % (ref 0.0–3.0)
Basophils Absolute: 0 10*3/uL (ref 0.0–0.1)
Eosinophils Absolute: 0.2 10*3/uL (ref 0.0–0.7)
Eosinophils Relative: 2.9 % (ref 0.0–5.0)
HCT: 41.2 % (ref 39.0–52.0)
HEMOGLOBIN: 14 g/dL (ref 13.0–17.0)
Lymphocytes Relative: 14.3 % (ref 12.0–46.0)
Lymphs Abs: 1 10*3/uL (ref 0.7–4.0)
MCHC: 34.1 g/dL (ref 30.0–36.0)
MCV: 86 fl (ref 78.0–100.0)
MONO ABS: 0.7 10*3/uL (ref 0.1–1.0)
Monocytes Relative: 9.9 % (ref 3.0–12.0)
Neutro Abs: 4.9 10*3/uL (ref 1.4–7.7)
Neutrophils Relative %: 72.4 % (ref 43.0–77.0)
Platelets: 216 10*3/uL (ref 150.0–400.0)
RBC: 4.78 Mil/uL (ref 4.22–5.81)
RDW: 13.4 % (ref 11.5–15.5)
WBC: 6.8 10*3/uL (ref 4.0–10.5)

## 2017-08-26 LAB — URINALYSIS, ROUTINE W REFLEX MICROSCOPIC
Bilirubin Urine: NEGATIVE
HGB URINE DIPSTICK: NEGATIVE
KETONES UR: NEGATIVE
LEUKOCYTES UA: NEGATIVE
NITRITE: NEGATIVE
Specific Gravity, Urine: 1.02 (ref 1.000–1.030)
Total Protein, Urine: NEGATIVE
UROBILINOGEN UA: 0.2 (ref 0.0–1.0)
Urine Glucose: NEGATIVE
WBC UA: NONE SEEN — AB (ref 0–?)
pH: 6 (ref 5.0–8.0)

## 2017-08-26 LAB — HEPATIC FUNCTION PANEL
ALT: 36 U/L (ref 0–53)
AST: 38 U/L — ABNORMAL HIGH (ref 0–37)
Albumin: 4.7 g/dL (ref 3.5–5.2)
Alkaline Phosphatase: 40 U/L (ref 39–117)
BILIRUBIN DIRECT: 0.1 mg/dL (ref 0.0–0.3)
BILIRUBIN TOTAL: 0.7 mg/dL (ref 0.2–1.2)
Total Protein: 7.1 g/dL (ref 6.0–8.3)

## 2017-08-26 LAB — LIPID PANEL
Cholesterol: 130 mg/dL (ref 0–200)
HDL: 38.3 mg/dL — ABNORMAL LOW (ref >39.00)
LDL CALC: 76 mg/dL (ref 0–99)
NONHDL: 91.67
Total CHOL/HDL Ratio: 3
Triglycerides: 79 mg/dL (ref 0.0–149.0)
VLDL: 15.8 mg/dL (ref 0.0–40.0)

## 2017-08-26 LAB — BASIC METABOLIC PANEL
BUN: 37 mg/dL — AB (ref 6–23)
CHLORIDE: 105 meq/L (ref 96–112)
CO2: 26 mEq/L (ref 19–32)
Calcium: 10 mg/dL (ref 8.4–10.5)
Creatinine, Ser: 1.34 mg/dL (ref 0.40–1.50)
GFR: 56.42 mL/min — AB (ref >60.00)
GLUCOSE: 108 mg/dL — AB (ref 70–99)
POTASSIUM: 5 meq/L (ref 3.5–5.1)
Sodium: 138 mEq/L (ref 135–145)

## 2017-08-26 LAB — TSH: TSH: 1.2 u[IU]/mL (ref 0.35–4.50)

## 2017-08-26 LAB — HEMOGLOBIN A1C: HEMOGLOBIN A1C: 6.2 % (ref 4.6–6.5)

## 2017-08-26 LAB — PSA: PSA: 1.97 ng/mL (ref 0.10–4.00)

## 2017-08-26 MED ORDER — ATORVASTATIN CALCIUM 10 MG PO TABS: ORAL_TABLET | ORAL | 3 refills | Status: DC

## 2017-08-26 MED ORDER — OLMESARTAN MEDOXOMIL-HCTZ 20-12.5 MG PO TABS: 1.0000 | ORAL_TABLET | Freq: Every day | ORAL | 3 refills | Status: DC

## 2017-08-26 NOTE — Patient Instructions (Addendum)
You will be contacted regarding the referral for: colonoscopy  Please continue all other medications as before, and refills have been done if requested.  Please have the pharmacy call with any other refills you may need.  Please continue your efforts at being more active, low cholesterol diet, and weight control.  You are otherwise up to date with prevention measures today.  Please keep your appointments with your specialists as you may have planned  Please go to the LAB in the Basement (turn left off the elevator) for the tests to be done today  You will be contacted by phone if any changes need to be made immediately.  Otherwise, you will receive a letter about your results with an explanation, but please check with MyChart first.  Please remember to sign up for MyChart if you have not done so, as this will be important to you in the future with finding out test results, communicating by private email, and scheduling acute appointments online when needed.  Please return in 1 year for your yearly visit, or sooner if needed, with Lab testing done 3-5 days before  

## 2017-08-26 NOTE — Assessment & Plan Note (Signed)

## 2017-08-26 NOTE — Progress Notes (Signed)
Subjective:    Patient ID: Vincent Mckay, male    DOB: Dec 10, 1950, 67 y.o.   MRN: [id number]  HPI  Here for wellness and f/u;  Overall doing ok;  Pt denies Chest pain, worsening SOB, DOE, wheezing, orthopnea, PND, worsening LE edema, palpitations, dizziness or syncope.  Pt denies neurological change such as new headache, facial or extremity weakness.  Pt denies polydipsia, polyuria, or low sugar symptoms. Pt states overall good compliance with treatment and medications, good tolerability, and has been trying to follow appropriate diet.  Pt denies worsening depressive symptoms, suicidal ideation or panic. No fever, night sweats, wt loss, loss of appetite, or other constitutional symptoms.  Pt states good ability with ADL's, has low fall risk, home safety reviewed and adequate, no other significant changes in hearing or vision, and only occasionally active with exercise.  Due for f/u colonoscopy  No new complaints Past Medical History:  Diagnosis Date  . Asthma   . ASTHMA, UNSPECIFIED, UNSPECIFIED STATUS 10/05/2008  . Chronic rhinitis 11/15/2008  . Cough 11/15/2008  . HAY FEVER 10/22/2006  . Hyperglycemia 09/12/2015  . HYPERLIPIDEMIA 12/01/2007  . HYPERTENSION 03/06/2008  . JAUNDICE 10/22/2006  . SINUSITIS- ACUTE-NOS 12/01/2007   Past Surgical History:  Procedure Laterality Date  . FINGER GANGLION CYST EXCISION     right index  . s/p left bicep tendon rupture  2004  . TONSILLECTOMY      reports that he has never smoked. He has never used smokeless tobacco. He reports that he does not drink alcohol or use drugs. family history includes Arthritis in his other; Diabetes in his other. Allergies  Allergen Reactions  . Ace Inhibitors     REACTION: Cough   Current Outpatient Medications on File Prior to Visit  Medication Sig Dispense Refill  . albuterol (PROAIR HFA) 108 (90 Base) MCG/ACT inhaler INHALE 2 PUFFS INTO THE LUNGS EVERY 6 HOURS AS NEEDED FOR WHEEZING OR SHORTNESS OF BREATH 3 Inhaler 3    . aspirin 81 MG EC tablet Take 1 tablet (81 mg total) by mouth daily. Swallow whole. 30 tablet 12  . beclomethasone (QVAR REDIHALER) 40 MCG/ACT inhaler Inhale 1 puff into the lungs 2 (two) times daily. Rinse mouth 3 Inhaler 3  . chlorpheniramine-HYDROcodone (TUSSIONEX PENNKINETIC ER) 10-8 MG/5ML SUER Take 5 mLs by mouth every 12 (twelve) hours as needed for cough. 200 mL 0  . co-enzyme Q-10 50 MG capsule Take 50 mg by mouth daily.      . fluticasone (FLONASE) 50 MCG/ACT nasal spray 2 sprays each nostril once daily 48 g 3  . Ginkgo 60 MG TABS Take 1 tablet by mouth daily.      . magic mouthwash SOLN Take 5 mLs by mouth 3 (three) times daily. 120 mL 0  . Red Yeast Rice Extract (RED YEAST RICE PO) Take 1 tablet by mouth daily.      . SOOLANTRA 1 % CREA Use as directed    . vitamin C (ASCORBIC ACID) 500 MG tablet Take 500 mg by mouth daily.       No current facility-administered medications on file prior to visit.    Review of Systems  Constitutional: Negative for other unusual diaphoresis or sweats HENT: Negative for ear discharge or swelling Eyes: Negative for other worsening visual disturbances Respiratory: Negative for stridor or other swelling  Gastrointestinal: Negative for worsening distension or other blood Genitourinary: Negative for retention or other urinary change Musculoskeletal: Negative for other MSK pain or swelling Skin: Negative  for color change or other new lesions Neurological: Negative for worsening tremors and other numbness  Psychiatric/Behavioral: Negative for worsening agitation or other fatigue All other system neg per pt    Objective:   Physical Exam BP 136/82   Pulse 64   Temp 98.4 F (36.9 C) (Oral)   Ht 5\' 9"  (1.753 m)   Wt 239 lb (108.4 kg)   SpO2 96%   BMI 35.29 kg/m  VS noted,  Constitutional: Pt is oriented to person, place, and time. Appears well-developed and well-nourished, in no significant distress and comfortable Head: Normocephalic and  atraumatic  Eyes: Conjunctivae and EOM are normal. Pupils are equal, round, and reactive to light Right Ear: External ear normal without discharge Left Ear: External ear normal without discharge Nose: Nose without discharge or deformity Mouth/Throat: Oropharynx is without other ulcerations and moist  Neck: Normal range of motion. Neck supple. No JVD present. No tracheal deviation present or significant neck LA or mass Cardiovascular: Normal rate, regular rhythm, normal heart sounds and intact distal pulses.   Pulmonary/Chest: WOB normal and breath sounds without rales or wheezing  Abdominal: Soft. Bowel sounds are normal. NT. No HSM  Musculoskeletal: Normal range of motion. Exhibits no edema Lymphadenopathy: Has no other cervical adenopathy.  Neurological: Pt is alert and oriented to person, place, and time. Pt has normal reflexes. No cranial nerve deficit. Motor grossly intact, Gait intact Skin: Skin is warm and dry. No rash noted or new ulcerations Psychiatric:  Has normal mood and affect. Behavior is normal without agitation No other exam findings Lab Results  Component Value Date   WBC 6.8 08/26/2017   HGB 14.0 08/26/2017   HCT 41.2 08/26/2017   PLT 216.0 08/26/2017   GLUCOSE 108 (H) 08/26/2017   CHOL 130 08/26/2017   TRIG 79.0 08/26/2017   HDL 38.30 (L) 08/26/2017   LDLDIRECT 161.8 05/16/2010   LDLCALC 76 08/26/2017   ALT 36 08/26/2017   AST 38 (H) 08/26/2017   NA 138 08/26/2017   K 5.0 08/26/2017   CL 105 08/26/2017   CREATININE 1.34 08/26/2017   BUN 37 (H) 08/26/2017   CO2 26 08/26/2017   TSH 1.20 08/26/2017   PSA 1.97 08/26/2017   HGBA1C 6.2 08/26/2017   \    Assessment & Plan:

## 2017-08-26 NOTE — Telephone Encounter (Signed)
Done

## 2017-08-26 NOTE — Telephone Encounter (Signed)
Patient needs refills that were sent in today [facility] to [facility] [place]. He does not use the mail order pharmacy.

## 2017-08-26 NOTE — Assessment & Plan Note (Signed)
Also for a1c with labs 

## 2017-09-01 MED ORDER — OLMESARTAN MEDOXOMIL 20 MG PO TABS: 20.0000 mg | ORAL_TABLET | Freq: Every day | ORAL | 3 refills | Status: DC

## 2017-09-01 MED ORDER — HYDROCHLOROTHIAZIDE 25 MG PO TABS: 12.5000 mg | ORAL_TABLET | Freq: Every day | ORAL | 3 refills | Status: DC

## 2017-09-01 NOTE — Telephone Encounter (Signed)
Per [facility], olmesartan-hydrocholorothiazide is on back order. Suggest another medication or split drug? CB# [phone] REF# [id number]

## 2017-09-01 NOTE — Telephone Encounter (Signed)
Ok to change to 2 rx that make up the same medication - done erx

## 2017-09-01 NOTE — Addendum Note (Signed)
Addended by: Biagio Borg on: 09/01/2017 01:16 PM   Modules accepted: Orders

## 2017-10-11 ENCOUNTER — Ambulatory Visit (INDEPENDENT_AMBULATORY_CARE_PROVIDER_SITE_OTHER): Payer: Medicare HMO

## 2017-10-11 DIAGNOSIS — Z23 Encounter for immunization: Secondary | ICD-10-CM | POA: Diagnosis not present

## 2017-12-01 DIAGNOSIS — H2510 Age-related nuclear cataract, unspecified eye: Secondary | ICD-10-CM | POA: Diagnosis not present

## 2017-12-01 DIAGNOSIS — E7800 Pure hypercholesterolemia, unspecified: Secondary | ICD-10-CM | POA: Diagnosis not present

## 2017-12-01 DIAGNOSIS — H524 Presbyopia: Secondary | ICD-10-CM | POA: Diagnosis not present

## 2017-12-01 DIAGNOSIS — I10 Essential (primary) hypertension: Secondary | ICD-10-CM | POA: Diagnosis not present

## 2017-12-16 ENCOUNTER — Encounter: Payer: Self-pay | Admitting: Internal Medicine

## 2017-12-23 ENCOUNTER — Telehealth: Payer: Self-pay | Admitting: Internal Medicine

## 2017-12-23 MED ORDER — FLUTICASONE PROPIONATE 50 MCG/ACT NA SUSP: NASAL | 3 refills | Status: DC

## 2017-12-23 MED ORDER — BECLOMETHASONE DIPROP HFA 40 MCG/ACT IN AERB: 1.0000 | INHALATION_SPRAY | Freq: Two times a day (BID) | RESPIRATORY_TRACT | 3 refills | Status: DC

## 2017-12-23 MED ORDER — ALBUTEROL SULFATE HFA 108 (90 BASE) MCG/ACT IN AERS: INHALATION_SPRAY | RESPIRATORY_TRACT | 3 refills | Status: DC

## 2017-12-23 NOTE — Telephone Encounter (Signed)
Spoke with the pt  He states ins is changing and he needs 90 day supply rxs on flonase, qvar and proair  He wants to pick up  I have placed these up front and made him aware of our new address  Nothing further needed

## 2018-01-03 ENCOUNTER — Other Ambulatory Visit: Payer: Self-pay | Admitting: Internal Medicine

## 2018-01-03 ENCOUNTER — Telehealth: Payer: Self-pay | Admitting: Internal Medicine

## 2018-01-03 MED ORDER — FLUTICASONE PROPIONATE HFA 110 MCG/ACT IN AERO: 2.0000 | INHALATION_SPRAY | Freq: Every day | RESPIRATORY_TRACT | 12 refills | Status: DC

## 2018-01-03 MED ORDER — HYDROCOD POLST-CPM POLST ER 10-8 MG/5ML PO SUER: 5.0000 mL | Freq: Two times a day (BID) | ORAL | 0 refills | Status: DC | PRN

## 2018-01-03 NOTE — Telephone Encounter (Signed)
Called and spoke with Vivien Rota, and Patient, about Dr. Annamaria Boots recommendations.  Flovent HFA 110, sent to [facility], per Patient request. Informed them Dr. Annamaria Boots has sent tussionex to pharmacy.  Understanding stated.  I called and spoke with [facility] [place], to let them know Albuterol can be generic, understanding stated.  Nothing further at this time.

## 2018-01-03 NOTE — Telephone Encounter (Signed)
Called and spoke with patients wife, she stated that the insurance will no longer cover the QVAR inhaler and she wants to know if there is something else that the patient can take. Albuterol is also getting expensive so she wanted to know about the generic form. Patient lost his prescription of tussionex that was written on 06/23/17 for an as needed basis. He found it yesterday and it expired on 12/23/17. He is requesting a new prescription for this incase he needs it. No current cough.   CY please advise, thank you.   Current Outpatient Medications on File Prior to Visit  Medication Sig Dispense Refill  . albuterol (PROAIR HFA) 108 (90 Base) MCG/ACT inhaler INHALE 2 PUFFS INTO THE LUNGS EVERY 6 HOURS AS NEEDED FOR WHEEZING OR SHORTNESS OF BREATH 3 Inhaler 3  . aspirin 81 MG EC tablet Take 1 tablet (81 mg total) by mouth daily. Swallow whole. 30 tablet 12  . atorvastatin (LIPITOR) 10 MG tablet TAKE 1 TABLET EVERY DAY AT 6PM 90 tablet 3  . beclomethasone (QVAR REDIHALER) 40 MCG/ACT inhaler Inhale 1 puff into the lungs 2 (two) times daily. Rinse mouth 3 Inhaler 3  . chlorpheniramine-HYDROcodone (TUSSIONEX PENNKINETIC ER) 10-8 MG/5ML SUER Take 5 mLs by mouth every 12 (twelve) hours as needed for cough. 200 mL 0  . co-enzyme Q-10 50 MG capsule Take 50 mg by mouth daily.      . fluticasone (FLONASE) 50 MCG/ACT nasal spray 2 sprays each nostril once daily 48 g 3  . Ginkgo 60 MG TABS Take 1 tablet by mouth daily.      . hydrochlorothiazide (HYDRODIURIL) 25 MG tablet Take 0.5 tablets (12.5 mg total) by mouth daily. 45 tablet 3  . magic mouthwash SOLN Take 5 mLs by mouth 3 (three) times daily. 120 mL 0  . olmesartan (BENICAR) 20 MG tablet Take 1 tablet (20 mg total) by mouth daily. 90 tablet 3  . Red Yeast Rice Extract (RED YEAST RICE PO) Take 1 tablet by mouth daily.      . SOOLANTRA 1 % CREA Use as directed    . vitamin C (ASCORBIC ACID) 500 MG tablet Take 500 mg by mouth daily.       No current  facility-administered medications on file prior to visit.    Allergies  Allergen Reactions  . Ace Inhibitors     REACTION: Cough

## 2018-01-03 NOTE — Telephone Encounter (Signed)
Try changing from Qvar to Flovent 110 hfa     Inhale 2 puffs, then rinse mouth, once daily   # 1, refill x 12  Ok to ask pharmacy for generic albuterol- not sure what they have  I will e-send Tussionex to pharmacy of record

## 2018-06-26 ENCOUNTER — Other Ambulatory Visit: Payer: Self-pay | Admitting: Internal Medicine

## 2018-06-29 ENCOUNTER — Other Ambulatory Visit: Payer: Self-pay | Admitting: Internal Medicine

## 2018-06-30 ENCOUNTER — Other Ambulatory Visit: Payer: Self-pay | Admitting: Internal Medicine

## 2018-07-02 IMAGING — DX DG CHEST 2V
2 series · 2 of 2 positions shown · non-contrast
Comparison: Chest x-ray of November 15, 2008

CLINICAL DATA: Cough, chest congestion, and shortness of breath for
the past 9 days. Recent onset of fever. History of asthma and
hyperlipidemia. Nonsmoker.

EXAM:
CHEST  2 VIEW

[chest pa]
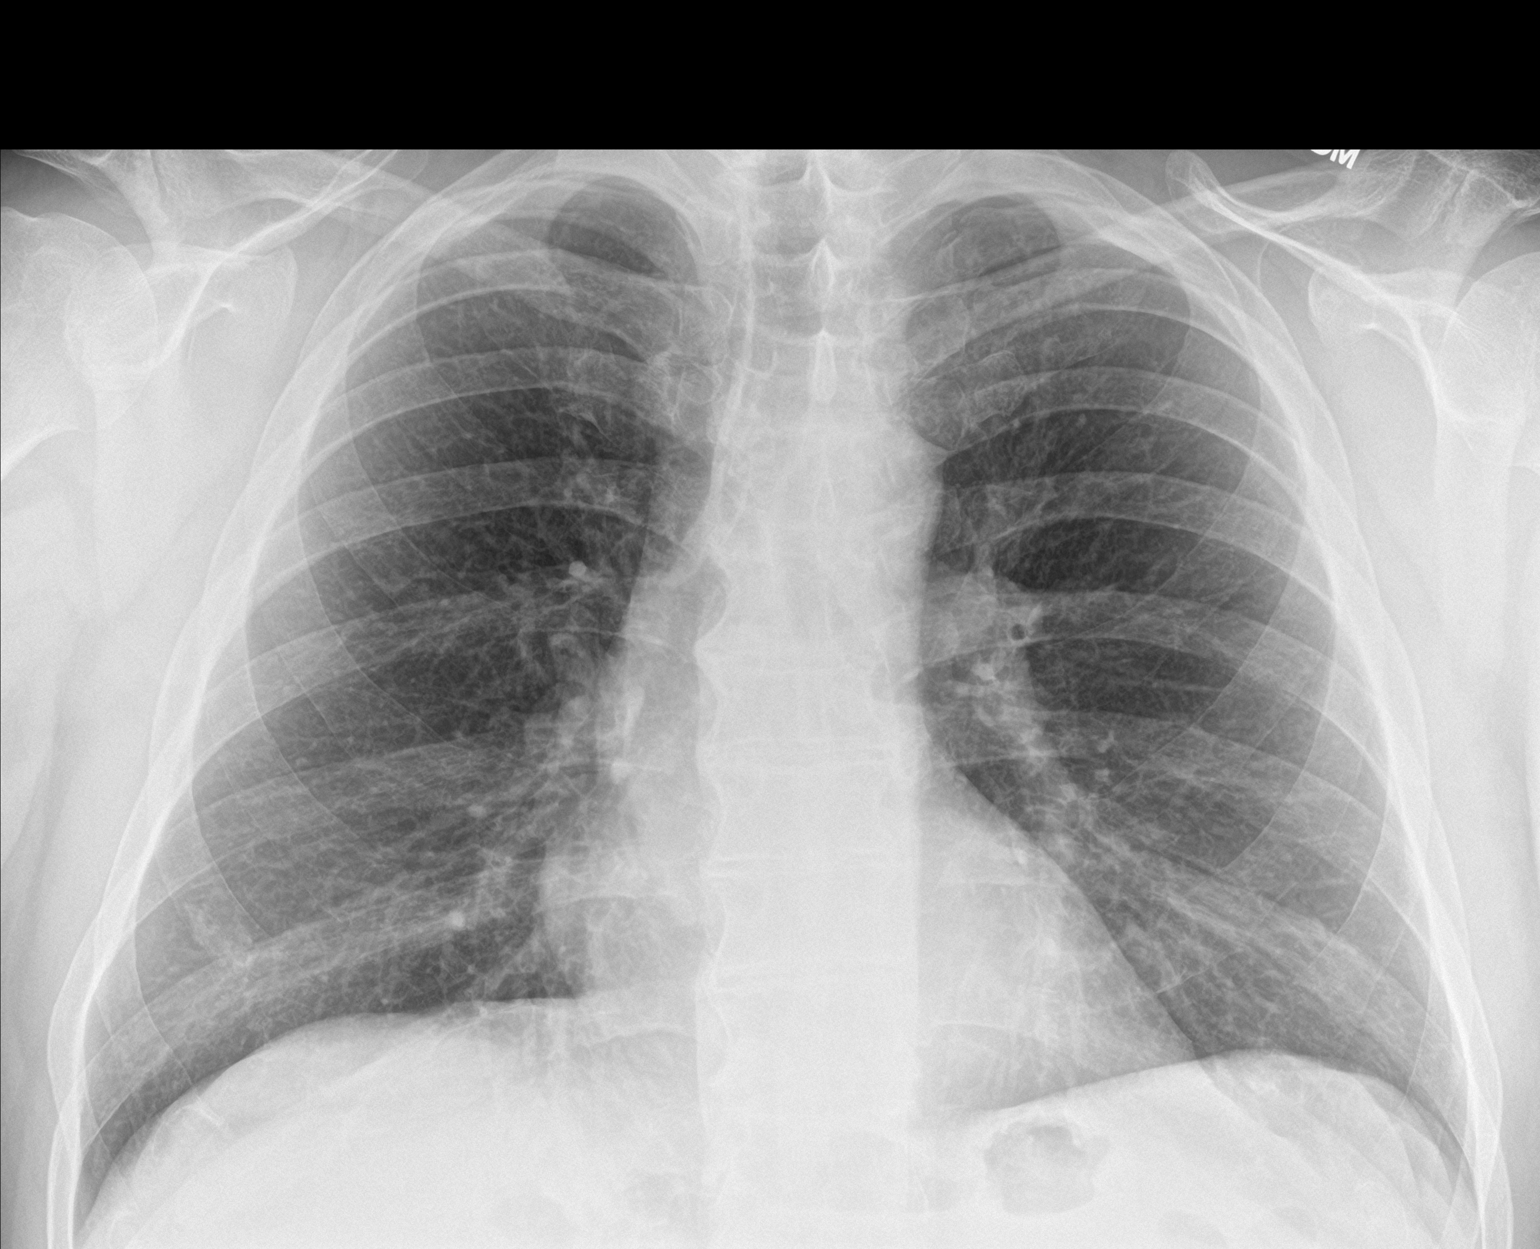

[chest lat]
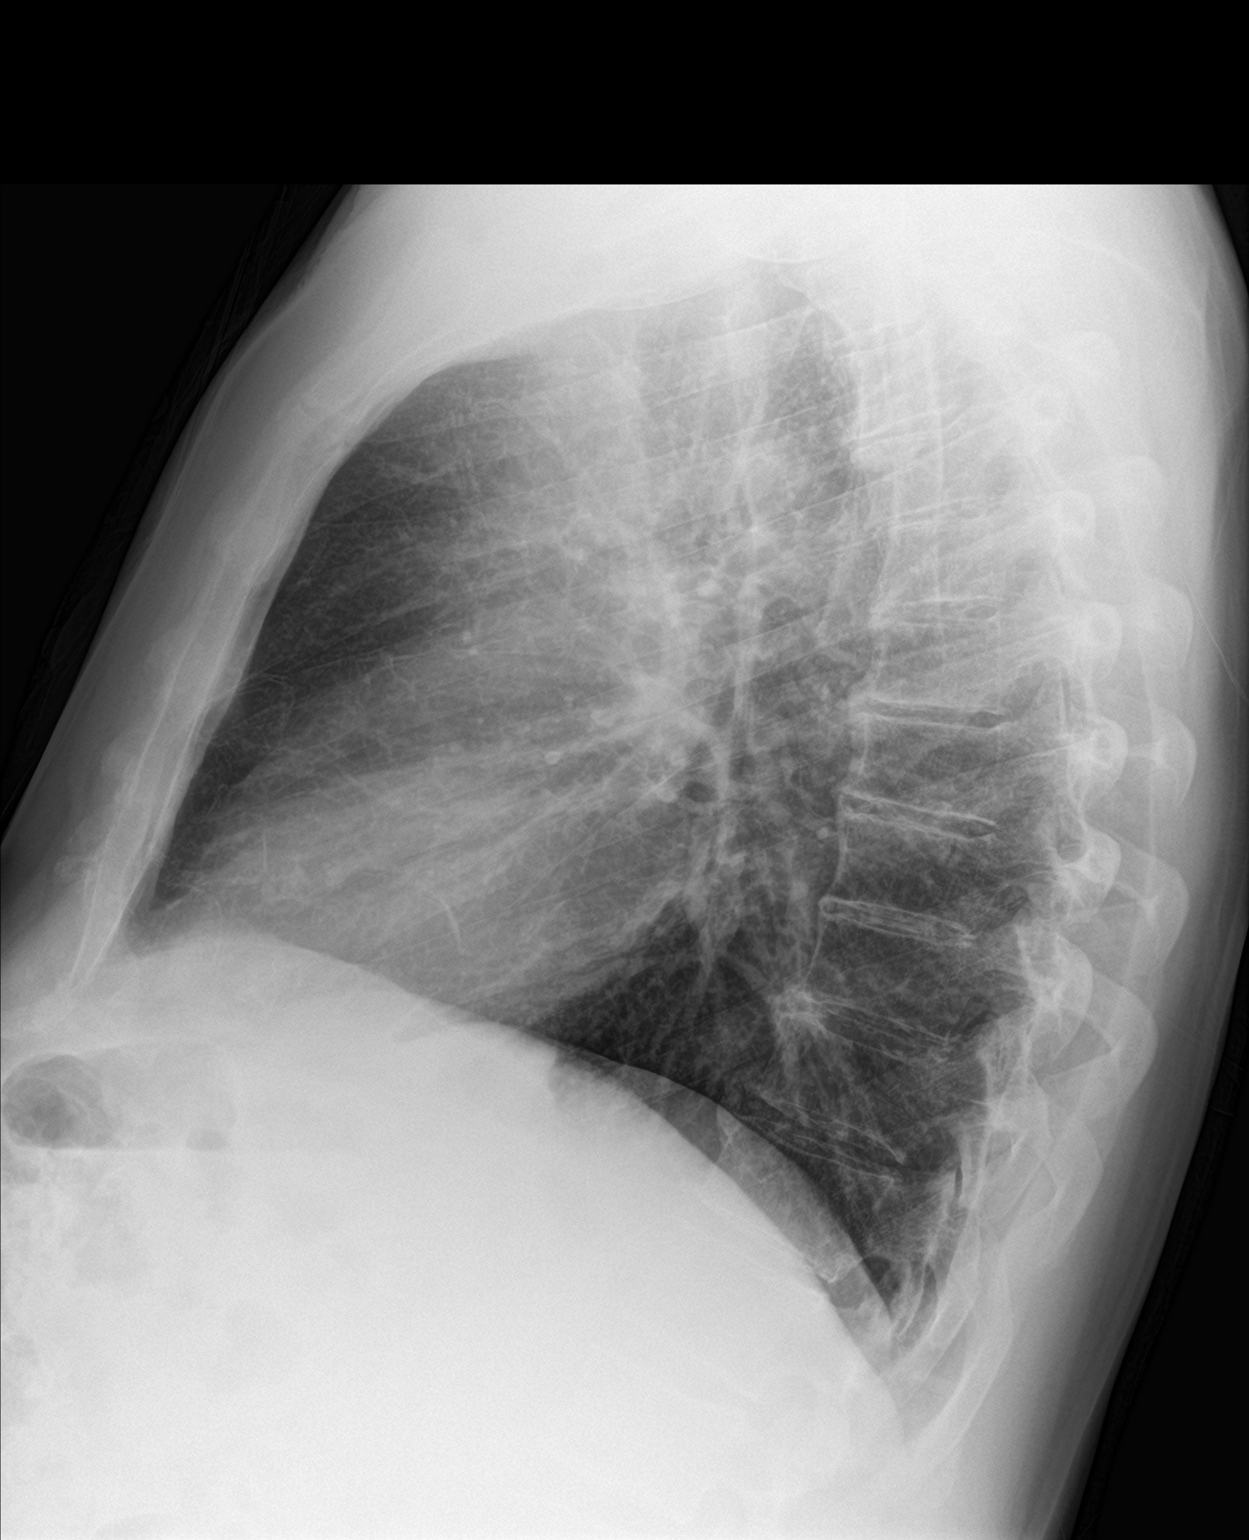

[2 of 2 positions shown; findings below may reference images not displayed]

FINDINGS: The lungs are adequately inflated. The interstitial markings are
coarse but not greatly changed. There is no alveolar infiltrate or
pleural effusion. The heart and pulmonary vascularity are normal.
The mediastinum is normal in width. The trachea is midline. There is
calcification in the wall of the aortic arch. The bony thorax
exhibits no acute abnormality.
IMPRESSION: Chronic bronchitic -asthmatic interstitial changes. No pneumonia nor
CHF.

Thoracic aortic atherosclerosis.

## 2018-07-03 IMAGING — CT CT ANGIO CHEST
2 of 9 series · 19 of 36 positions shown · IV contrast (isovue)
Comparison: Chest radiographs 03/02/2016

CLINICAL DATA: Elevated D-dimer.

EXAM:
CT ANGIOGRAPHY CHEST WITH CONTRAST
TECHNIQUE: Multidetector CT imaging of the chest was performed using the
standard protocol during bolus administration of intravenous
contrast. Multiplanar CT image reconstructions and MIPs were
obtained to evaluate the vascular anatomy.
CONTRAST:  100 mL Isovue 370

[Series 7: pe coronal mpr · coronal · 0.66mm/px · 1 of 161 slices shown]
[im 81/161  mediastinal]
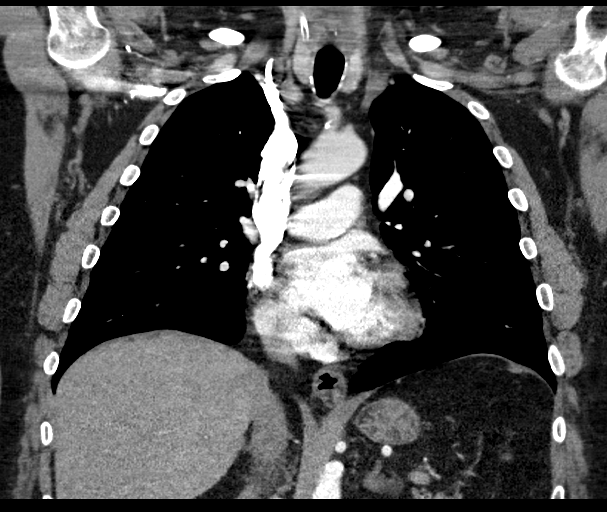

[Series 11: pe thins · axial · 0.88mm/px · z∈[-330,-40]mm · 18 of 326 slices shown]
[im 18/326  lung]
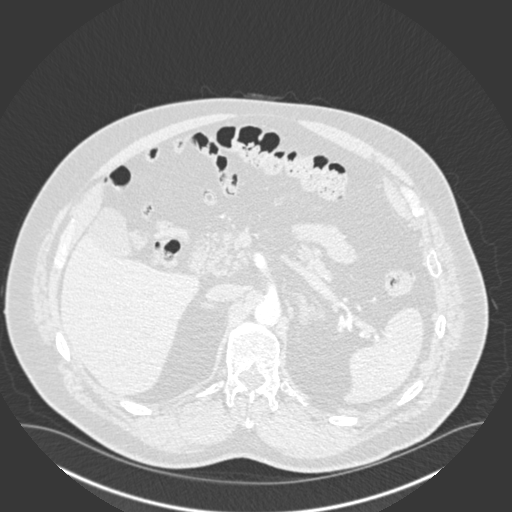
[im 35/326  mediastinal]
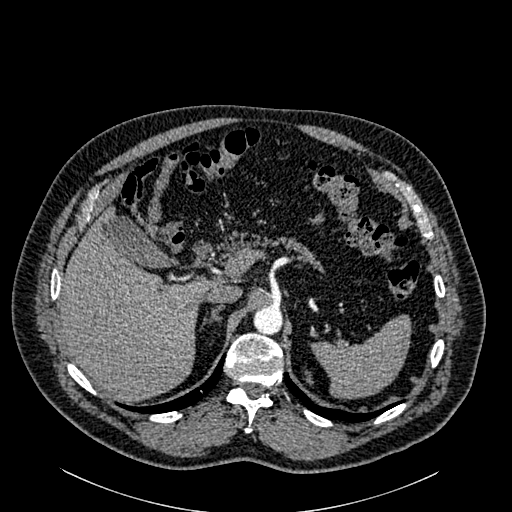
[im 52/326  lung]
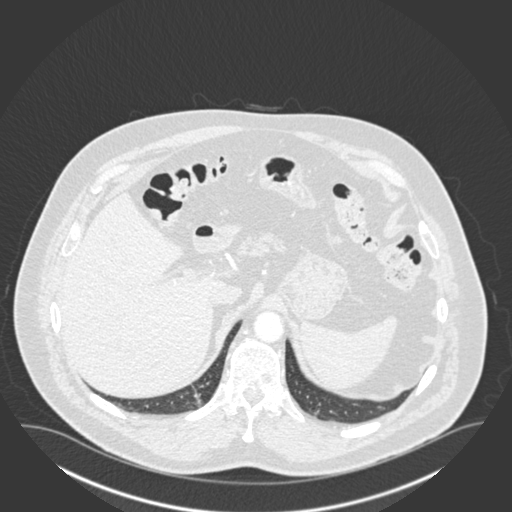
[im 69/326  mediastinal]
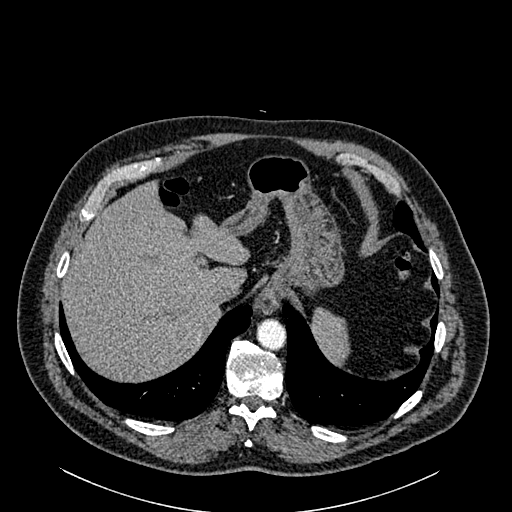
[im 86/326  lung]
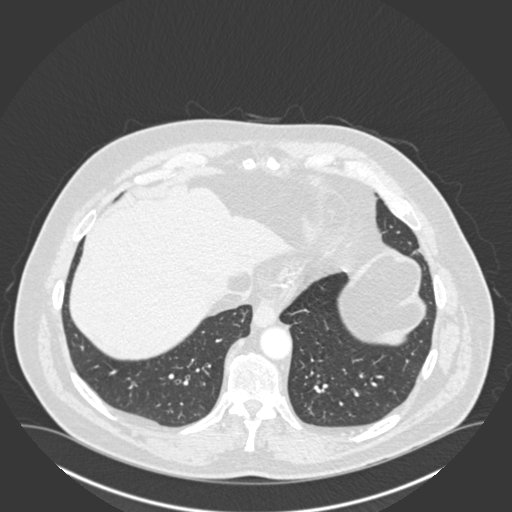
[im 103/326  mediastinal]
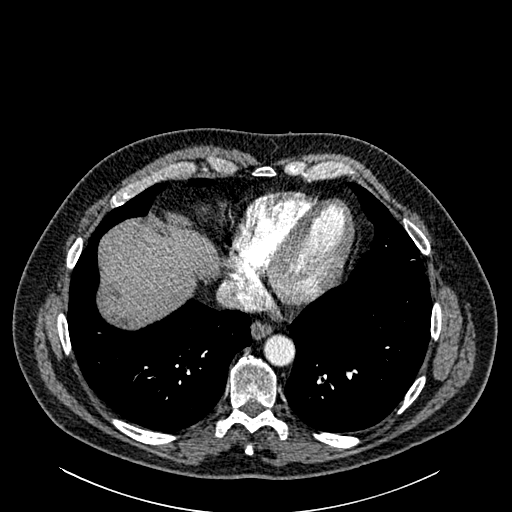
[im 120/326  lung]
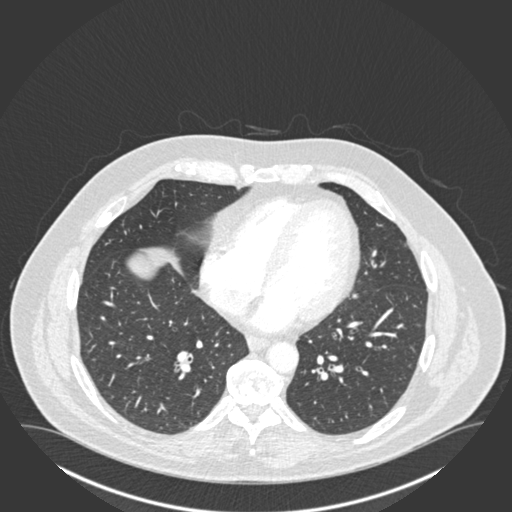
[im 137/326  mediastinal]
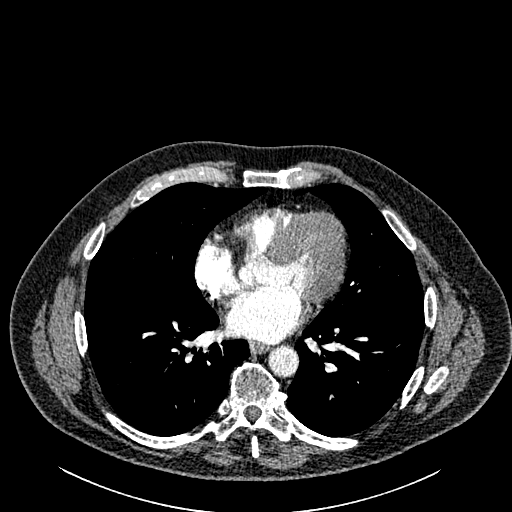
[im 154/326  lung]
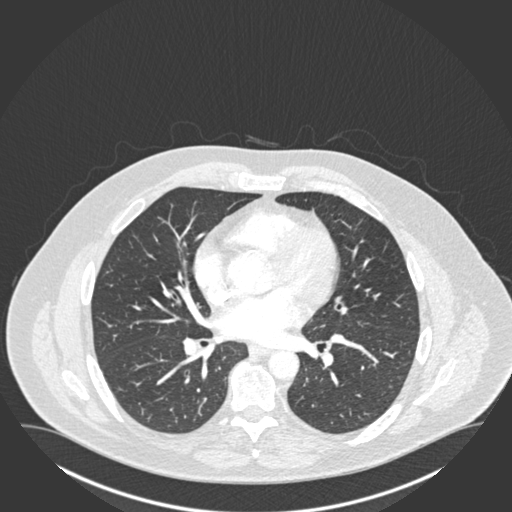
[im 172/326  mediastinal]
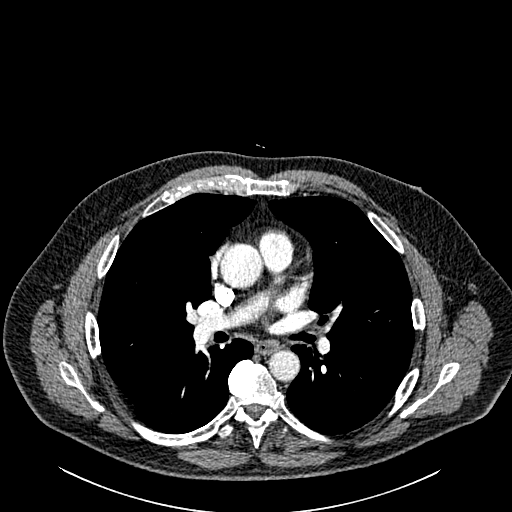
[im 189/326  lung]
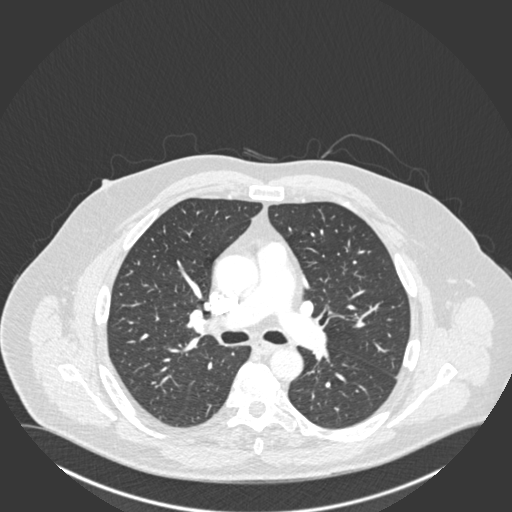
[im 206/326  mediastinal]
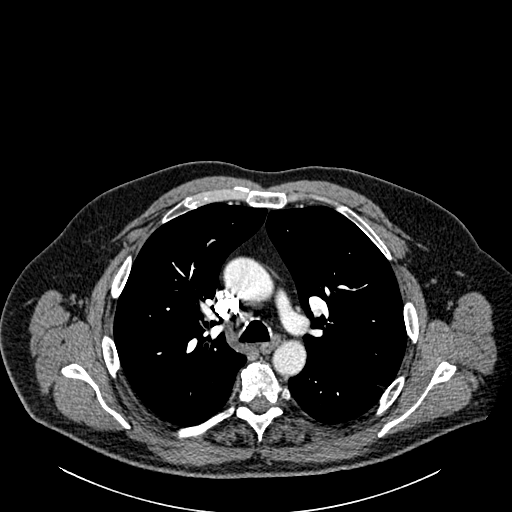
[im 223/326  lung]
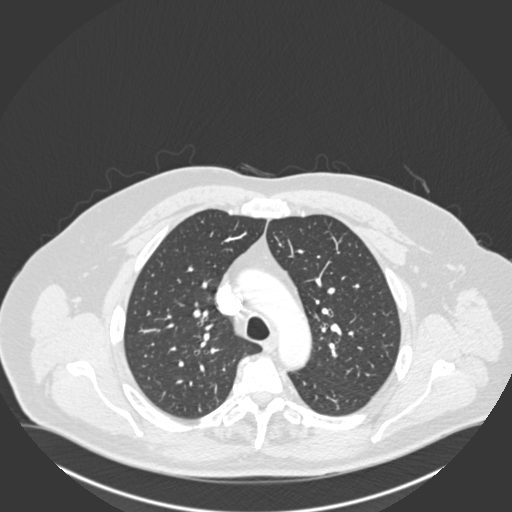
[im 240/326  mediastinal]
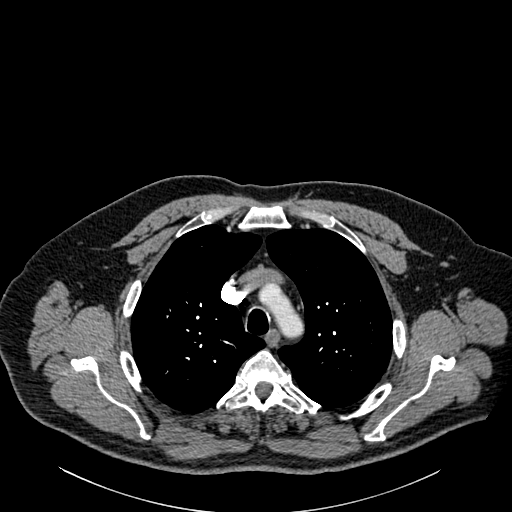
[im 257/326  lung]
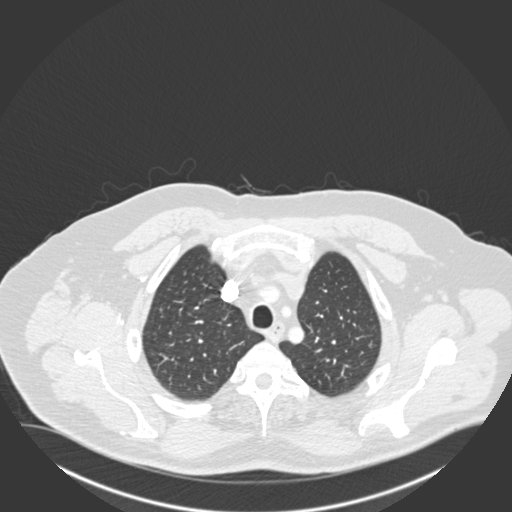
[im 274/326  mediastinal]
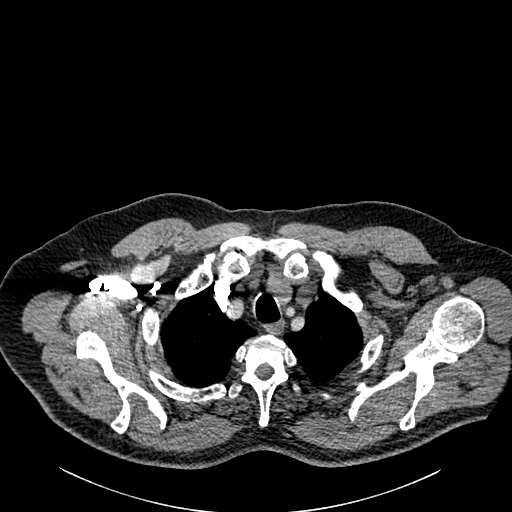
[im 291/326  lung]
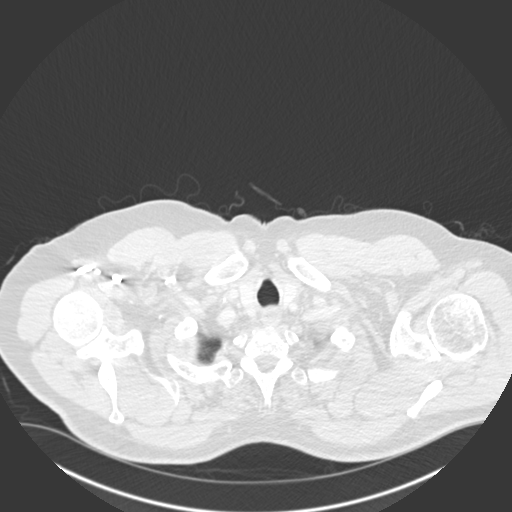
[im 308/326  mediastinal]
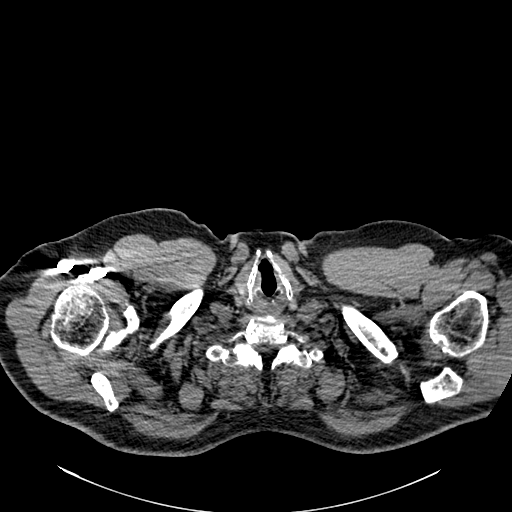

[19 of 36 positions shown; findings below may reference images not displayed]

FINDINGS: Cardiovascular: Pulmonary arterial opacification is adequate without
evidence of emboli. There is no evidence of thoracic aortic aneurysm
or dissection. Left main and LAD coronary artery atherosclerosis is
noted. The heart is normal in size. No pericardial effusion.

Mediastinum/Nodes: No enlarged axillary, mediastinal, or hilar lymph
nodes. Suspected small sliding hiatal hernia. Unremarkable thyroid
and trachea.

Lungs/Pleura: No pleural effusion or pneumothorax. Scattered areas
of minimal scarring and/or atelectasis in the lower lobes an
lingula. No mass.

Upper Abdomen: Mild aortic atherosclerotic plaque.

Musculoskeletal: Thoracic spondylosis. No suspicious osseous lesion.

Review of the MIP images confirms the above findings.
IMPRESSION: 1. No evidence of pulmonary emboli or other acute abnormality in the
chest.
2. Aortic and coronary artery atherosclerosis.
These results will be called to the ordering clinician or
representative by the [HOSPITAL] at the imaging location.

## 2018-07-05 DIAGNOSIS — D225 Melanocytic nevi of trunk: Secondary | ICD-10-CM | POA: Diagnosis not present

## 2018-07-05 DIAGNOSIS — D1801 Hemangioma of skin and subcutaneous tissue: Secondary | ICD-10-CM | POA: Diagnosis not present

## 2018-07-05 DIAGNOSIS — L718 Other rosacea: Secondary | ICD-10-CM | POA: Diagnosis not present

## 2018-07-05 DIAGNOSIS — L821 Other seborrheic keratosis: Secondary | ICD-10-CM | POA: Diagnosis not present

## 2018-07-05 DIAGNOSIS — L570 Actinic keratosis: Secondary | ICD-10-CM | POA: Diagnosis not present

## 2018-07-05 DIAGNOSIS — D485 Neoplasm of uncertain behavior of skin: Secondary | ICD-10-CM | POA: Diagnosis not present

## 2018-07-05 DIAGNOSIS — Z85828 Personal history of other malignant neoplasm of skin: Secondary | ICD-10-CM | POA: Diagnosis not present

## 2018-07-05 DIAGNOSIS — C4441 Basal cell carcinoma of skin of scalp and neck: Secondary | ICD-10-CM | POA: Diagnosis not present

## 2018-08-31 ENCOUNTER — Other Ambulatory Visit (INDEPENDENT_AMBULATORY_CARE_PROVIDER_SITE_OTHER): Payer: Medicare HMO

## 2018-08-31 ENCOUNTER — Encounter: Payer: Self-pay | Admitting: Internal Medicine

## 2018-08-31 ENCOUNTER — Other Ambulatory Visit: Payer: Self-pay

## 2018-08-31 ENCOUNTER — Ambulatory Visit (INDEPENDENT_AMBULATORY_CARE_PROVIDER_SITE_OTHER): Payer: Medicare HMO | Admitting: Internal Medicine

## 2018-08-31 VITALS — BP 134/82 | HR 64 | Temp 96.8°F | Ht 69.0 in | Wt 240.0 lb

## 2018-08-31 DIAGNOSIS — E538 Deficiency of other specified B group vitamins: Secondary | ICD-10-CM | POA: Diagnosis not present

## 2018-08-31 DIAGNOSIS — E611 Iron deficiency: Secondary | ICD-10-CM | POA: Diagnosis not present

## 2018-08-31 DIAGNOSIS — Z23 Encounter for immunization: Secondary | ICD-10-CM

## 2018-08-31 DIAGNOSIS — Z0000 Encounter for general adult medical examination without abnormal findings: Secondary | ICD-10-CM | POA: Diagnosis not present

## 2018-08-31 DIAGNOSIS — E559 Vitamin D deficiency, unspecified: Secondary | ICD-10-CM

## 2018-08-31 DIAGNOSIS — R739 Hyperglycemia, unspecified: Secondary | ICD-10-CM

## 2018-08-31 LAB — CBC WITH DIFFERENTIAL/PLATELET
Basophils Absolute: 0 10*3/uL (ref 0.0–0.1)
Basophils Relative: 0.4 % (ref 0.0–3.0)
Eosinophils Absolute: 0.3 10*3/uL (ref 0.0–0.7)
Eosinophils Relative: 4.4 % (ref 0.0–5.0)
HCT: 41.3 % (ref 39.0–52.0)
Hemoglobin: 14.3 g/dL (ref 13.0–17.0)
Lymphocytes Relative: 13.3 % (ref 12.0–46.0)
Lymphs Abs: 1.1 10*3/uL (ref 0.7–4.0)
MCHC: 34.5 g/dL (ref 30.0–36.0)
MCV: 86.6 fl (ref 78.0–100.0)
Monocytes Absolute: 0.9 10*3/uL (ref 0.1–1.0)
Monocytes Relative: 12 % (ref 3.0–12.0)
Neutro Abs: 5.5 10*3/uL (ref 1.4–7.7)
Neutrophils Relative %: 69.9 % (ref 43.0–77.0)
Platelets: 220 10*3/uL (ref 150.0–400.0)
RBC: 4.77 Mil/uL (ref 4.22–5.81)
RDW: 13.1 % (ref 11.5–15.5)
WBC: 7.9 10*3/uL (ref 4.0–10.5)

## 2018-08-31 LAB — URINALYSIS, ROUTINE W REFLEX MICROSCOPIC
Bilirubin Urine: NEGATIVE
Hgb urine dipstick: NEGATIVE
Ketones, ur: NEGATIVE
Leukocytes,Ua: NEGATIVE
Nitrite: NEGATIVE
RBC / HPF: NONE SEEN (ref 0–?)
Specific Gravity, Urine: 1.02 (ref 1.000–1.030)
Total Protein, Urine: NEGATIVE
Urine Glucose: NEGATIVE
Urobilinogen, UA: 0.2 (ref 0.0–1.0)
WBC, UA: NONE SEEN (ref 0–?)
pH: 6 (ref 5.0–8.0)

## 2018-08-31 LAB — LIPID PANEL
Cholesterol: 137 mg/dL (ref 0–200)
HDL: 44.3 mg/dL (ref >39.00)
LDL Cholesterol: 72 mg/dL (ref 0–99)
NonHDL: 92.62
Total CHOL/HDL Ratio: 3
Triglycerides: 104 mg/dL (ref 0.0–149.0)
VLDL: 20.8 mg/dL (ref 0.0–40.0)

## 2018-08-31 LAB — BASIC METABOLIC PANEL
BUN: 27 mg/dL — ABNORMAL HIGH (ref 6–23)
CO2: 27 mEq/L (ref 19–32)
Calcium: 9.7 mg/dL (ref 8.4–10.5)
Chloride: 104 mEq/L (ref 96–112)
Creatinine, Ser: 1.17 mg/dL (ref 0.40–1.50)
GFR: 61.89 mL/min (ref >60.00)
Glucose, Bld: 105 mg/dL — ABNORMAL HIGH (ref 70–99)
Potassium: 4.7 mEq/L (ref 3.5–5.1)
Sodium: 138 mEq/L (ref 135–145)

## 2018-08-31 LAB — IBC PANEL
Iron: 116 ug/dL (ref 42–165)
Saturation Ratios: 31 % (ref 20.0–50.0)
Transferrin: 267 mg/dL (ref 212.0–360.0)

## 2018-08-31 LAB — VITAMIN D 25 HYDROXY (VIT D DEFICIENCY, FRACTURES): VITD: 65.49 ng/mL (ref 30.00–100.00)

## 2018-08-31 LAB — HEPATIC FUNCTION PANEL
ALT: 34 U/L (ref 0–53)
AST: 30 U/L (ref 0–37)
Albumin: 4.7 g/dL (ref 3.5–5.2)
Alkaline Phosphatase: 41 U/L (ref 39–117)
Bilirubin, Direct: 0.2 mg/dL (ref 0.0–0.3)
Total Bilirubin: 0.8 mg/dL (ref 0.2–1.2)
Total Protein: 6.9 g/dL (ref 6.0–8.3)

## 2018-08-31 LAB — TSH: TSH: 2.15 u[IU]/mL (ref 0.35–4.50)

## 2018-08-31 LAB — PSA: PSA: 1.74 ng/mL (ref 0.10–4.00)

## 2018-08-31 LAB — HEMOGLOBIN A1C: Hgb A1c MFr Bld: 5.9 % (ref 4.6–6.5)

## 2018-08-31 LAB — VITAMIN B12: Vitamin B-12: 297 pg/mL (ref 211–911)

## 2018-08-31 MED ORDER — ATORVASTATIN CALCIUM 10 MG PO TABS: ORAL_TABLET | ORAL | 3 refills | Status: DC

## 2018-08-31 MED ORDER — OLMESARTAN MEDOXOMIL 20 MG PO TABS: 20.0000 mg | ORAL_TABLET | Freq: Every day | ORAL | 3 refills | Status: DC

## 2018-08-31 NOTE — Assessment & Plan Note (Signed)

## 2018-08-31 NOTE — Patient Instructions (Addendum)
You had the Tdap tetanus shot today, and the flu shot  You will be contacted regarding the referral for: colonoscopy  Please continue all other medications as before, and refills have been done if requested.  Please have the pharmacy call with any other refills you may need.  Please continue your efforts at being more active, low cholesterol diet, and weight control.  You are otherwise up to date with prevention measures today.  Please keep your appointments with your specialists as you may have planned  Please go to the LAB in the Basement (turn left off the elevator) for the tests to be done today  You will be contacted by phone if any changes need to be made immediately.  Otherwise, you will receive a letter about your results with an explanation, but please check with MyChart first.  Please remember to sign up for MyChart if you have not done so, as this will be important to you in the future with finding out test results, communicating by private email, and scheduling acute appointments online when needed.  Please return in 1 year for your yearly visit, or sooner if needed, with Lab testing done 3-5 days before

## 2018-08-31 NOTE — Assessment & Plan Note (Signed)
stable overall by history and exam, recent data reviewed with pt, and pt to continue medical treatment as before,  to f/u any worsening symptoms or concerns  

## 2018-08-31 NOTE — Progress Notes (Signed)
Subjective:    Patient ID: Vincent Mckay, male    DOB: 11-Feb-1950, 68 y.o.   MRN: [id number]  HPI Here for wellness and f/u;  Overall doing ok;  Pt denies Chest pain, worsening SOB, DOE, wheezing, orthopnea, PND, worsening LE edema, palpitations, dizziness or syncope.  Pt denies neurological change such as new headache, facial or extremity weakness.  Pt denies polydipsia, polyuria, or low sugar symptoms. Pt states overall good compliance with treatment and medications, good tolerability, and has been trying to follow appropriate diet.  Pt denies worsening depressive symptoms, suicidal ideation or panic. No fever, night sweats, wt loss, loss of appetite, or other constitutional symptoms.  Pt states good ability with ADL's, has low fall risk, home safety reviewed and adequate, no other significant changes in hearing or vision, and only occasionally active with exercise. No new complaints Past Medical History:  Diagnosis Date  . Asthma   . ASTHMA, UNSPECIFIED, UNSPECIFIED STATUS 10/05/2008  . Chronic rhinitis 11/15/2008  . Cough 11/15/2008  . HAY FEVER 10/22/2006  . Hyperglycemia 09/12/2015  . HYPERLIPIDEMIA 12/01/2007  . HYPERTENSION 03/06/2008  . JAUNDICE 10/22/2006  . SINUSITIS- ACUTE-NOS 12/01/2007   Past Surgical History:  Procedure Laterality Date  . FINGER GANGLION CYST EXCISION     right index  . s/p left bicep tendon rupture  2004  . TONSILLECTOMY      reports that he has never smoked. He has never used smokeless tobacco. He reports that he does not drink alcohol or use drugs. family history includes Arthritis in an other family member; Diabetes in an other family member. Allergies  Allergen Reactions  . Ace Inhibitors     REACTION: Cough   Current Outpatient Medications on File Prior to Visit  Medication Sig Dispense Refill  . albuterol (VENTOLIN HFA) 108 (90 Base) MCG/ACT inhaler INHALE 2 PUFFS INTO THE LUNGS EVERY 6 HOURS AS NEEDED FOR WHEEZING OR SHORTNESS OF BREATH 54 Inhaler  1  . aspirin 81 MG EC tablet Take 1 tablet (81 mg total) by mouth daily. Swallow whole. 30 tablet 12  . chlorpheniramine-HYDROcodone (TUSSIONEX PENNKINETIC ER) 10-8 MG/5ML SUER Take 5 mLs by mouth every 12 (twelve) hours as needed for cough. 200 mL 0  . co-enzyme Q-10 50 MG capsule Take 50 mg by mouth daily.      . fluticasone (FLONASE) 50 MCG/ACT nasal spray SPRAY 2 SPRAYS INTO EACH NOSTRIL EVERY DAY 48 mL 1  . fluticasone (FLOVENT HFA) 110 MCG/ACT inhaler Inhale 2 puffs into the lungs daily. 1 Inhaler 12  . Ginkgo 60 MG TABS Take 1 tablet by mouth daily.      . magic mouthwash SOLN Take 5 mLs by mouth 3 (three) times daily. 120 mL 0  . Red Yeast Rice Extract (RED YEAST RICE PO) Take 1 tablet by mouth daily.      . SOOLANTRA 1 % CREA Use as directed    . vitamin C (ASCORBIC ACID) 500 MG tablet Take 500 mg by mouth daily.       No current facility-administered medications on file prior to visit.    Review of Systems Constitutional: Negative for other unusual diaphoresis, sweats, appetite or weight changes HENT: Negative for other worsening hearing loss, ear pain, facial swelling, mouth sores or neck stiffness.   Eyes: Negative for other worsening pain, redness or other visual disturbance.  Respiratory: Negative for other stridor or swelling Cardiovascular: Negative for other palpitations or other chest pain  Gastrointestinal: Negative for worsening diarrhea or  loose stools, blood in stool, distention or other pain Genitourinary: Negative for hematuria, flank pain or other change in urine volume.  Musculoskeletal: Negative for myalgias or other joint swelling.  Skin: Negative for other color change, or other wound or worsening drainage.  Neurological: Negative for other syncope or numbness. Hematological: Negative for other adenopathy or swelling Psychiatric/Behavioral: Negative for hallucinations, other worsening agitation, SI, self-injury, or new decreased concentration All other system  neg per pt    Objective:   Physical Exam BP 134/82   Pulse 64   Temp (!) 96.8 F (36 C) (Oral)   Ht 5\' 9"  (1.753 m)   Wt 240 lb (108.9 kg)   SpO2 96%   BMI 35.44 kg/m  VS noted,  Constitutional: Pt is oriented to person, place, and time. Appears well-developed and well-nourished, in no significant distress and comfortable Head: Normocephalic and atraumatic  Eyes: Conjunctivae and EOM are normal. Pupils are equal, round, and reactive to light Right Ear: External ear normal without discharge Left Ear: External ear normal without discharge Nose: Nose without discharge or deformity Mouth/Throat: Oropharynx is without other ulcerations and moist  Neck: Normal range of motion. Neck supple. No JVD present. No tracheal deviation present or significant neck LA or mass Cardiovascular: Normal rate, regular rhythm, normal heart sounds and intact distal pulses.   Pulmonary/Chest: WOB normal and breath sounds without rales or wheezing  Abdominal: Soft. Bowel sounds are normal. NT. No HSM  Musculoskeletal: Normal range of motion. Exhibits no edema Lymphadenopathy: Has no other cervical adenopathy.  Neurological: Pt is alert and oriented to person, place, and time. Pt has normal reflexes. No cranial nerve deficit. Motor grossly intact, Gait intact Skin: Skin is warm and dry. No rash noted or new ulcerations Psychiatric:  Has normal mood and affect. Behavior is normal without agitation Lab Results  Component Value Date   WBC 6.8 08/26/2017   HGB 14.0 08/26/2017   HCT 41.2 08/26/2017   PLT 216.0 08/26/2017   GLUCOSE 108 (H) 08/26/2017   CHOL 130 08/26/2017   TRIG 79.0 08/26/2017   HDL 38.30 (L) 08/26/2017   LDLDIRECT 161.8 05/16/2010   LDLCALC 76 08/26/2017   ALT 36 08/26/2017   AST 38 (H) 08/26/2017   NA 138 08/26/2017   K 5.0 08/26/2017   CL 105 08/26/2017   CREATININE 1.34 08/26/2017   BUN 37 (H) 08/26/2017   CO2 26 08/26/2017   TSH 1.20 08/26/2017   PSA 1.97 08/26/2017   HGBA1C  6.2 08/26/2017          Assessment & Plan:

## 2018-09-06 ENCOUNTER — Other Ambulatory Visit: Payer: Self-pay | Admitting: Internal Medicine

## 2018-11-28 DIAGNOSIS — H524 Presbyopia: Secondary | ICD-10-CM | POA: Diagnosis not present

## 2018-11-28 DIAGNOSIS — H52223 Regular astigmatism, bilateral: Secondary | ICD-10-CM | POA: Diagnosis not present

## 2018-12-22 DIAGNOSIS — C4441 Basal cell carcinoma of skin of scalp and neck: Secondary | ICD-10-CM | POA: Diagnosis not present

## 2018-12-23 DIAGNOSIS — Z20828 Contact with and (suspected) exposure to other viral communicable diseases: Secondary | ICD-10-CM | POA: Diagnosis not present

## 2019-01-23 ENCOUNTER — Ambulatory Visit: Payer: Medicare HMO | Admitting: Family Medicine

## 2019-01-23 ENCOUNTER — Ambulatory Visit: Payer: Medicare HMO | Admitting: Physician Assistant

## 2019-01-23 ENCOUNTER — Encounter: Payer: Self-pay | Admitting: Physician Assistant

## 2019-01-23 ENCOUNTER — Ambulatory Visit (INDEPENDENT_AMBULATORY_CARE_PROVIDER_SITE_OTHER): Payer: Medicare HMO

## 2019-01-23 ENCOUNTER — Other Ambulatory Visit: Payer: Self-pay

## 2019-01-23 DIAGNOSIS — R0781 Pleurodynia: Secondary | ICD-10-CM

## 2019-01-23 NOTE — Progress Notes (Signed)
Office Visit Note   Patient: Vincent Mckay           Date of Birth: 11-May-1950           MRN: [id number] Visit Date: 01/23/2019              Requested by: Biagio Borg, MD [place],  [place] [id number] PCP: Biagio Borg, MD   Assessment & Plan: Visit Diagnoses:  1. Rib pain     Plan: Due to the fact the patient is having no pain in his posterior right ribs at this point time would not recommend any type of treatment.  Would like to see him back in 2 weeks if his pain persist for reevaluation.  He can cancel appointment if he is overall doing well or return sooner if his condition becomes worse.  Follow-Up Instructions: Return in about 2 weeks (around 02/06/2019).   Orders:  Orders Placed This Encounter  Procedures  . XR Ribs Unilateral Right   No orders of the defined types were placed in this encounter.     Procedures: No procedures performed   Clinical Data: No additional findings.   Subjective: Chief Complaint  Patient presents with  . Chest Pain    rib pain    HPI Vincent Mckay comes in today with right sided rib pain since this past Friday.  States today he is having no pain.  However yesterday he was having pain in the right lower rib region posteriorly.  States pain will decrease with lying down.  Worse with coughing.  He is unsure if this is a rib fracture or muscle cramp.  Wednesday did take out a heavy trash can.  He also had a massage and adjustment with his chiropractor last weeks and unsure if this due to the adjustment.  He has had no fevers chills.  He denies any numbness tingling down either arm.  Denies any shortness of breath chest pain.  He is requesting x-rays of his ribs today to rule out a fracture. Review of Systems Se HPI  Objective: Vital Signs: There were no vitals taken for this visit.  Physical Exam Constitutional:      General: He is not in acute distress.    Appearance: He is not ill-appearing or diaphoretic.    Cardiovascular:     Rate and Rhythm: Normal rate and regular rhythm.  Pulmonary:     Effort: Pulmonary effort is normal.     Breath sounds: Normal breath sounds.  Skin:    Findings: No ecchymosis, erythema or rash.  Neurological:     Mental Status: He is alert and oriented to person, place, and time.  Psychiatric:        Mood and Affect: Mood normal.     Ortho Exam He has no tenderness with palpation of the anterior or posterior ribs.  No tenderness throughout his abdomen on palpation.  There is no rashes skin lesions or ecchymosis about the posterior chest wall. Specialty Comments:  No specialty comments available.  Imaging: XR Ribs Unilateral Right  Result Date: 01/23/2019 Right rib films 3 views: Lungs appear well aerated.  No obvious acute fractures or bony abnormalities.    PMFS History: Patient Active Problem List   Diagnosis Date Noted  . Skin lesion 09/25/2016  . Dyspnea 03/02/2016  . Hyperglycemia 09/12/2015  . Preventative health care 05/13/2010  . Chronic rhinitis 11/15/2008  . Cough 11/15/2008  . Moderate intermittent asthma 10/05/2008  . Essential  hypertension 03/06/2008  . Hyperlipidemia 12/01/2007   Past Medical History:  Diagnosis Date  . Asthma   . ASTHMA, UNSPECIFIED, UNSPECIFIED STATUS 10/05/2008  . Chronic rhinitis 11/15/2008  . Cough 11/15/2008  . HAY FEVER 10/22/2006  . Hyperglycemia 09/12/2015  . HYPERLIPIDEMIA 12/01/2007  . HYPERTENSION 03/06/2008  . JAUNDICE 10/22/2006  . SINUSITIS- ACUTE-NOS 12/01/2007    Family History  Problem Relation Age of Onset  . Arthritis Other   . Diabetes Other     Past Surgical History:  Procedure Laterality Date  . FINGER GANGLION CYST EXCISION     right index  . s/p left bicep tendon rupture  2004  . TONSILLECTOMY     Social History   Occupational History  . 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 Smoking status: Never Smoker  . Smokeless tobacco: Never Used  Substance  and Sexual Activity  . Alcohol use: No  . Drug use: No  . Sexual activity: Not on file

## 2019-01-25 ENCOUNTER — Ambulatory Visit: Payer: Medicare HMO | Admitting: Physician Assistant

## 2019-02-01 DIAGNOSIS — L718 Other rosacea: Secondary | ICD-10-CM | POA: Diagnosis not present

## 2019-02-01 DIAGNOSIS — L814 Other melanin hyperpigmentation: Secondary | ICD-10-CM | POA: Diagnosis not present

## 2019-02-01 DIAGNOSIS — D1801 Hemangioma of skin and subcutaneous tissue: Secondary | ICD-10-CM | POA: Diagnosis not present

## 2019-02-01 DIAGNOSIS — L308 Other specified dermatitis: Secondary | ICD-10-CM | POA: Diagnosis not present

## 2019-02-01 DIAGNOSIS — L821 Other seborrheic keratosis: Secondary | ICD-10-CM | POA: Diagnosis not present

## 2019-02-01 DIAGNOSIS — D225 Melanocytic nevi of trunk: Secondary | ICD-10-CM | POA: Diagnosis not present

## 2019-02-01 DIAGNOSIS — L218 Other seborrheic dermatitis: Secondary | ICD-10-CM | POA: Diagnosis not present

## 2019-02-01 DIAGNOSIS — L570 Actinic keratosis: Secondary | ICD-10-CM | POA: Diagnosis not present

## 2019-02-01 DIAGNOSIS — Z85828 Personal history of other malignant neoplasm of skin: Secondary | ICD-10-CM | POA: Diagnosis not present

## 2019-02-01 DIAGNOSIS — L905 Scar conditions and fibrosis of skin: Secondary | ICD-10-CM | POA: Diagnosis not present

## 2019-02-06 ENCOUNTER — Ambulatory Visit: Payer: Medicare HMO | Admitting: Physician Assistant

## 2019-02-26 ENCOUNTER — Ambulatory Visit: Payer: Medicare HMO

## 2019-03-06 DIAGNOSIS — L570 Actinic keratosis: Secondary | ICD-10-CM | POA: Diagnosis not present

## 2019-03-06 DIAGNOSIS — L218 Other seborrheic dermatitis: Secondary | ICD-10-CM | POA: Diagnosis not present

## 2019-03-15 ENCOUNTER — Other Ambulatory Visit: Payer: Self-pay

## 2019-03-15 MED ORDER — OLMESARTAN MEDOXOMIL-HCTZ 20-12.5 MG PO TABS: 1.0000 | ORAL_TABLET | Freq: Every day | ORAL | 1 refills | Status: DC

## 2019-06-26 ENCOUNTER — Ambulatory Visit: Payer: Medicare HMO | Admitting: Internal Medicine

## 2019-06-26 ENCOUNTER — Encounter: Payer: Self-pay | Admitting: Internal Medicine

## 2019-06-26 ENCOUNTER — Telehealth: Payer: Self-pay | Admitting: Internal Medicine

## 2019-06-26 ENCOUNTER — Other Ambulatory Visit: Payer: Self-pay

## 2019-06-26 DIAGNOSIS — J4520 Mild intermittent asthma, uncomplicated: Secondary | ICD-10-CM

## 2019-06-26 DIAGNOSIS — IMO0001 Reserved for inherently not codable concepts without codable children: Secondary | ICD-10-CM

## 2019-06-26 DIAGNOSIS — J310 Chronic rhinitis: Secondary | ICD-10-CM

## 2019-06-26 MED ORDER — ALBUTEROL SULFATE HFA 108 (90 BASE) MCG/ACT IN AERS: INHALATION_SPRAY | RESPIRATORY_TRACT | 4 refills | Status: DC

## 2019-06-26 MED ORDER — FLOVENT HFA 110 MCG/ACT IN AERO: 2.0000 | INHALATION_SPRAY | Freq: Every day | RESPIRATORY_TRACT | 4 refills | Status: DC

## 2019-06-26 MED ORDER — FLUTICASONE PROPIONATE 50 MCG/ACT NA SUSP: NASAL | 4 refills | Status: DC

## 2019-06-26 MED ORDER — FLOVENT HFA 110 MCG/ACT IN AERO: 2.0000 | INHALATION_SPRAY | Freq: Two times a day (BID) | RESPIRATORY_TRACT | 4 refills | Status: DC

## 2019-06-26 NOTE — Telephone Encounter (Signed)
I called and spoke with Chelsea at [facility] and notified of response per Dr Annamaria Boots  She verbalized understanding  Rx was updated in [facility]

## 2019-06-26 NOTE — Patient Instructions (Signed)
Med refills sent to [facility]  Glad you are doing well. Please call if we can help

## 2019-06-26 NOTE — Telephone Encounter (Signed)
Dr Annamaria Boots- can you please confirm dosing on flovent 110 - we sent 2 puffs daily, pharm states it's normally prescribed 1 puff bid or 2 puffs bid, thanks!

## 2019-06-26 NOTE — Progress Notes (Signed)
HPI male never smoker followed for Asthma, allergic rhinitis, complicated by HBP, ASCVD Office spirometry 12/25/08  ------------------------------------------------------------------------------------   06/23/2017- 69 year old male never smoker followed for Asthma, allergic rhinitis, complicated by HBP,  ASCVD ----Asthma: Pt denies any wheezing, major cough, or congestion since last visit.  Pro-air HFA, Qvar RediHaler 40, He is using 1 puff of Qvar and 1 puff of pro-air most days and feels very well controlled.  Denies sleep disturbance or any significant episodic flares since last here.  Seasonal Flonase. Has retired now from [employer] - less exposure now. Asks tussionex to hold for winter- discussed.   06/26/19- 69 year old male never smoker followed for Asthma, allergic rhinitis, complicated by HBP,ASCVD, Hyperlipidemia,  Ventolin HFA, Flovent 110 hfa, , Had 2 Moderna Covax Wife had Covid infection but he did not. No recent asthma exacerbation. Seasonal rhinitis in the Spring- controlled.   ROS-see HPI + = positive Constitutional:   No-   weight loss, night sweats, fevers, chills, fatigue, lassitude. HEENT:   No-  headaches, difficulty swallowing, tooth/dental problems, sore throat,       No-  sneezing, itching, ear ache, nasal congestion, post nasal drip,  CV:  No-   chest pain, orthopnea, PND, swelling in lower extremities, anasarca, dizziness, palpitations Resp: No-   shortness of breath with exertion or at rest.              No-   productive cough,  No non-productive cough,  No- coughing up of blood.              No-   change in color of mucus.  No- wheezing.   Skin: No-   rash or lesions. GI:  No-   heartburn, indigestion, abdominal pain, nausea, vomiting,  GU:  MS:  No-   joint pain or swelling.  . Neuro-     nothing unusual Psych:  No- change in mood or affect. No depression or anxiety.  No memory loss.  OBJ- Physical Exam General- Alert, Oriented, Affect-appropriate,  Distress- none acute, overweight Skin- rash-none, lesions- none, excoriation- none Lymphadenopathy- none Head- atraumatic            Eyes- Gross vision intact, PERRLA, conjunctivae and secretions clear            Ears- Hearing, canals-normal            Nose- Clear, no-Septal dev, mucus, polyps, erosion, perforation             Throat- Mallampati II , mucosa clear , drainage- none, tonsils- atrophic Neck- flexible , trachea midline, no stridor , thyroid nl, carotid no bruit Chest - symmetrical excursion , unlabored           Heart/CV- RRR , no murmur , no gallop  , no rub, nl s1 s2                           - JVD- none , edema- none, stasis changes- none, varices- none           Lung- clear to P&A, wheeze- none, cough- none , dullness-none, rub- none           Chest wall-  Abd- + obese with faint residual periumbilical bruising but no palpable ventral hernia. Br/ Gen/ Rectal- Not done, not indicated Extrem- cyanosis- none, clubbing, none, atrophy- none, strength- nl Neuro- grossly intact to observation

## 2019-06-26 NOTE — Telephone Encounter (Signed)
2 puffs, then rinse mouth, twice daily     Thanks

## 2019-06-29 DIAGNOSIS — L570 Actinic keratosis: Secondary | ICD-10-CM | POA: Diagnosis not present

## 2019-06-29 DIAGNOSIS — L814 Other melanin hyperpigmentation: Secondary | ICD-10-CM | POA: Diagnosis not present

## 2019-06-29 DIAGNOSIS — L718 Other rosacea: Secondary | ICD-10-CM | POA: Diagnosis not present

## 2019-06-29 DIAGNOSIS — D485 Neoplasm of uncertain behavior of skin: Secondary | ICD-10-CM | POA: Diagnosis not present

## 2019-06-29 DIAGNOSIS — Z85828 Personal history of other malignant neoplasm of skin: Secondary | ICD-10-CM | POA: Diagnosis not present

## 2019-06-29 DIAGNOSIS — X32XXXS Exposure to sunlight, sequela: Secondary | ICD-10-CM | POA: Diagnosis not present

## 2019-06-29 DIAGNOSIS — L578 Other skin changes due to chronic exposure to nonionizing radiation: Secondary | ICD-10-CM | POA: Diagnosis not present

## 2019-06-29 DIAGNOSIS — L821 Other seborrheic keratosis: Secondary | ICD-10-CM | POA: Diagnosis not present

## 2019-06-29 DIAGNOSIS — D1801 Hemangioma of skin and subcutaneous tissue: Secondary | ICD-10-CM | POA: Diagnosis not present

## 2019-09-01 ENCOUNTER — Encounter: Payer: Self-pay | Admitting: Internal Medicine

## 2019-09-01 ENCOUNTER — Ambulatory Visit (INDEPENDENT_AMBULATORY_CARE_PROVIDER_SITE_OTHER): Payer: Medicare HMO

## 2019-09-01 ENCOUNTER — Other Ambulatory Visit: Payer: Self-pay

## 2019-09-01 ENCOUNTER — Ambulatory Visit (INDEPENDENT_AMBULATORY_CARE_PROVIDER_SITE_OTHER): Payer: Medicare HMO | Admitting: Internal Medicine

## 2019-09-01 VITALS — BP 118/60 | HR 57 | Temp 98.4°F | Ht 71.0 in | Wt 244.0 lb

## 2019-09-01 VITALS — BP 160/80 | HR 61 | Temp 98.4°F | Ht 71.0 in | Wt 244.0 lb

## 2019-09-01 DIAGNOSIS — Z0000 Encounter for general adult medical examination without abnormal findings: Secondary | ICD-10-CM

## 2019-09-01 DIAGNOSIS — E559 Vitamin D deficiency, unspecified: Secondary | ICD-10-CM | POA: Diagnosis not present

## 2019-09-01 DIAGNOSIS — E538 Deficiency of other specified B group vitamins: Secondary | ICD-10-CM | POA: Diagnosis not present

## 2019-09-01 DIAGNOSIS — Z1211 Encounter for screening for malignant neoplasm of colon: Secondary | ICD-10-CM | POA: Diagnosis not present

## 2019-09-01 DIAGNOSIS — R739 Hyperglycemia, unspecified: Secondary | ICD-10-CM

## 2019-09-01 LAB — COMPLETE METABOLIC PANEL WITH GFR
AG Ratio: 1.9 (calc) (ref 1.0–2.5)
ALT: 41 U/L (ref 9–46)
AST: 40 U/L — ABNORMAL HIGH (ref 10–35)
Albumin: 4.6 g/dL (ref 3.6–5.1)
Alkaline phosphatase (APISO): 48 U/L (ref 35–144)
BUN: 25 mg/dL (ref 7–25)
CO2: 26 mmol/L (ref 20–32)
Calcium: 10 mg/dL (ref 8.6–10.3)
Chloride: 105 mmol/L (ref 98–110)
Creat: 1.2 mg/dL (ref 0.70–1.25)
GFR, Est African American: 71 mL/min/{1.73_m2} (ref >=60)
GFR, Est Non African American: 61 mL/min/{1.73_m2} (ref >=60)
Globulin: 2.4 g/dL (calc) (ref 1.9–3.7)
Glucose, Bld: 99 mg/dL (ref 65–99)
Potassium: 4.9 mmol/L (ref 3.5–5.3)
Sodium: 138 mmol/L (ref 135–146)
Total Bilirubin: 0.7 mg/dL (ref 0.2–1.2)
Total Protein: 7 g/dL (ref 6.1–8.1)

## 2019-09-01 MED ORDER — OLMESARTAN MEDOXOMIL-HCTZ 20-12.5 MG PO TABS
1.0000 | ORAL_TABLET | Freq: Every day | ORAL | 3 refills | Status: DC
Start: 2019-09-01 — End: 2020-09-25

## 2019-09-01 MED ORDER — ATORVASTATIN CALCIUM 10 MG PO TABS
ORAL_TABLET | ORAL | 3 refills | Status: DC
Start: 2019-09-01 — End: 2020-09-17

## 2019-09-01 NOTE — Patient Instructions (Signed)
Vincent Mckay , Thank you for taking time to come for your Medicare Wellness Visit. I appreciate your ongoing commitment to your health goals. Please review the following plan we discussed and let me know if I can assist you in the future.   Screening recommendations/referrals: Colonoscopy: 12/12/2010; due every 5-10 years (12/10/2020) Recommended yearly ophthalmology/optometry visit for glaucoma screening and checkup Recommended yearly dental visit for hygiene and checkup  Vaccinations: Influenza vaccine: 08/31/2018 Pneumococcal vaccine: completed Tdap vaccine: 08/31/2018 Shingles vaccine: declined Covid-19: completed  Advanced directives: Please bring a copy of your health care power of attorney and living will to the office at your convenience.  Conditions/risks identified: Yes; Please continue to do your personal lifestyle choices by: daily care of teeth and gums, regular physical activity (goal should be 5 days a week for 30 minutes), eat a healthy diet, avoid tobacco and drug use, limiting any alcohol intake, taking a low-dose aspirin (if not allergic or have been advised by your provider otherwise) and taking vitamins and minerals as recommended by your provider. Continue doing brain stimulating activities (puzzles, reading, adult coloring books, staying active) to keep memory sharp. Continue to eat heart healthy diet (full of fruits, vegetables, whole grains, lean protein, water--limit salt, fat, and sugar intake) and increase physical activity as tolerated.  Next appointment: Please schedule your next Medicare Wellness Visit with your Nurse Health Advisor in 1 year.  Preventive Care 24 Years and Older, Male Preventive care refers to lifestyle choices and visits with your health care provider that can promote health and wellness. What does preventive care include?  A yearly physical exam. This is also called an annual well check.  Dental exams once or twice a year.  Routine eye exams.  Ask your health care provider how often you should have your eyes checked.  Personal lifestyle choices, including:  Daily care of your teeth and gums.  Regular physical activity.  Eating a healthy diet.  Avoiding tobacco and drug use.  Limiting alcohol use.  Practicing safe sex.  Taking low doses of aspirin every day.  Taking vitamin and mineral supplements as recommended by your health care provider. What happens during an annual well check? The services and screenings done by your health care provider during your annual well check will depend on your age, overall health, lifestyle risk factors, and family history of disease. Counseling  Your health care provider may ask you questions about your:  Alcohol use.  Tobacco use.  Drug use.  Emotional well-being.  Home and relationship well-being.  Sexual activity.  Eating habits.  History of falls.  Memory and ability to understand (cognition).  Work and work Statistician. Screening  You may have the following tests or measurements:  Height, weight, and BMI.  Blood pressure.  Lipid and cholesterol levels. These may be checked every 5 years, or more frequently if you are over 80 years old.  Skin check.  Lung cancer screening. You may have this screening every year starting at age 39 if you have a 30-pack-year history of smoking and currently smoke or have quit within the past 15 years.  Fecal occult blood test (FOBT) of the stool. You may have this test every year starting at age 48.  Flexible sigmoidoscopy or colonoscopy. You may have a sigmoidoscopy every 5 years or a colonoscopy every 10 years starting at age 38.  Prostate cancer screening. Recommendations will vary depending on your family history and other risks.  Hepatitis C blood test.  Hepatitis B blood  test.  Sexually transmitted disease (STD) testing.  Diabetes screening. This is done by checking your blood sugar (glucose) after you have not  eaten for a while (fasting). You may have this done every 1-3 years.  Abdominal aortic aneurysm (AAA) screening. You may need this if you are a current or former smoker.  Osteoporosis. You may be screened starting at age 41 if you are at high risk. Talk with your health care provider about your test results, treatment options, and if necessary, the need for more tests. Vaccines  Your health care provider may recommend certain vaccines, such as:  Influenza vaccine. This is recommended every year.  Tetanus, diphtheria, and acellular pertussis (Tdap, Td) vaccine. You may need a Td booster every 10 years.  Zoster vaccine. You may need this after age 11.  Pneumococcal 13-valent conjugate (PCV13) vaccine. One dose is recommended after age 78.  Pneumococcal polysaccharide (PPSV23) vaccine. One dose is recommended after age 18. Talk to your health care provider about which screenings and vaccines you need and how often you need them. This information is not intended to replace advice given to you by your health care provider. Make sure you discuss any questions you have with your health care provider. Document Released: 01/25/2015 Document Revised: 09/18/2015 Document Reviewed: 10/30/2014 Elsevier Interactive Patient Education  2017 [address] Prevention in the Home Falls can cause injuries. They can happen to people of all ages. There are many things you can do to make your home safe and to help prevent falls. What can I do on the outside of my home?  Regularly fix the edges of walkways and driveways and fix any cracks.  Remove anything that might make you trip as you walk through a door, such as a raised step or threshold.  Trim any bushes or trees on the path to your home.  Use bright outdoor lighting.  Clear any walking paths of anything that might make someone trip, such as rocks or tools.  Regularly check to see if handrails are loose or broken. Make sure that both sides  of any steps have handrails.  Any raised decks and porches should have guardrails on the edges.  Have any leaves, snow, or ice cleared regularly.  Use sand or salt on walking paths during winter.  Clean up any spills in your garage right away. This includes oil or grease spills. What can I do in the bathroom?  Use night lights.  Install grab bars by the toilet and in the tub and shower. Do not use towel bars as grab bars.  Use non-skid mats or decals in the tub or shower.  If you need to sit down in the shower, use a plastic, non-slip stool.  Keep the floor dry. Clean up any water that spills on the floor as soon as it happens.  Remove soap buildup in the tub or shower regularly.  Attach bath mats securely with double-sided non-slip rug tape.  Do not have throw rugs and other things on the floor that can make you trip. What can I do in the bedroom?  Use night lights.  Make sure that you have a light by your bed that is easy to reach.  Do not use any sheets or blankets that are too big for your bed. They should not hang down onto the floor.  Have a firm chair that has side arms. You can use this for support while you get dressed.  Do not have throw rugs  and other things on the floor that can make you trip. What can I do in the kitchen?  Clean up any spills right away.  Avoid walking on wet floors.  Keep items that you use a lot in easy-to-reach places.  If you need to reach something above you, use a strong step stool that has a grab bar.  Keep electrical cords out of the way.  Do not use floor polish or wax that makes floors slippery. If you must use wax, use non-skid floor wax.  Do not have throw rugs and other things on the floor that can make you trip. What can I do with my stairs?  Do not leave any items on the stairs.  Make sure that there are handrails on both sides of the stairs and use them. Fix handrails that are broken or loose. Make sure that  handrails are as long as the stairways.  Check any carpeting to make sure that it is firmly attached to the stairs. Fix any carpet that is loose or worn.  Avoid having throw rugs at the top or bottom of the stairs. If you do have throw rugs, attach them to the floor with carpet tape.  Make sure that you have a light switch at the top of the stairs and the bottom of the stairs. If you do not have them, ask someone to add them for you. What else can I do to help prevent falls?  Wear shoes that:  Do not have high heels.  Have rubber bottoms.  Are comfortable and fit you well.  Are closed at the toe. Do not wear sandals.  If you use a stepladder:  Make sure that it is fully opened. Do not climb a closed stepladder.  Make sure that both sides of the stepladder are locked into place.  Ask someone to hold it for you, if possible.  Clearly mark and make sure that you can see:  Any grab bars or handrails.  First and last steps.  Where the edge of each step is.  Use tools that help you move around (mobility aids) if they are needed. These include:  Canes.  Walkers.  Scooters.  Crutches.  Turn on the lights when you go into a dark area. Replace any light bulbs as soon as they burn out.  Set up your furniture so you have a clear path. Avoid moving your furniture around.  If any of your floors are uneven, fix them.  If there are any pets around you, be aware of where they are.  Review your medicines with your doctor. Some medicines can make you feel dizzy. This can increase your chance of falling. Ask your doctor what other things that you can do to help prevent falls. This information is not intended to replace advice given to you by your health care provider. Make sure you discuss any questions you have with your health care provider. Document Released: 10/25/2008 Document Revised: 06/06/2015 Document Reviewed: 02/02/2014 Elsevier Interactive Patient Education  2017  [employer].

## 2019-09-01 NOTE — Progress Notes (Signed)
Subjective:    Patient ID: Vincent Mckay, male    DOB: 02-08-1950, 69 y.o.   MRN: [id number]  HPI  Here for wellness and f/u;  Overall doing ok;  Pt denies Chest pain, worsening SOB, DOE, wheezing, orthopnea, PND, worsening LE edema, palpitations, dizziness or syncope.  Pt denies neurological change such as new headache, facial or extremity weakness.  Pt denies polydipsia, polyuria, or low sugar symptoms. Pt states overall good compliance with treatment and medications, good tolerability, and has been trying to follow appropriate diet.  Pt denies worsening depressive symptoms, suicidal ideation or panic. No fever, night sweats, wt loss, loss of appetite, or other constitutional symptoms.  Pt states good ability with ADL's, has low fall risk, home safety reviewed and adequate, no other significant changes in hearing or vision, and only occasionally active with exercise. No new complaints Wt Readings from Last 3 Encounters:  09/01/19 244 lb (110.7 kg)  09/01/19 244 lb (110.7 kg)  06/26/19 243 lb 9.6 oz (110.5 kg)   Past Medical History:  Diagnosis Date  . Asthma   . ASTHMA, UNSPECIFIED, UNSPECIFIED STATUS 10/05/2008  . Chronic rhinitis 11/15/2008  . Cough 11/15/2008  . HAY FEVER 10/22/2006  . Hyperglycemia 09/12/2015  . HYPERLIPIDEMIA 12/01/2007  . HYPERTENSION 03/06/2008  . JAUNDICE 10/22/2006  . SINUSITIS- ACUTE-NOS 12/01/2007   Past Surgical History:  Procedure Laterality Date  . FINGER GANGLION CYST EXCISION     right index  . s/p left bicep tendon rupture  2004  . TONSILLECTOMY      reports that he has never smoked. He has never used smokeless tobacco. He reports that he does not drink alcohol and does not use drugs. family history includes Arthritis in an other family member; Diabetes in an other family member. Allergies  Allergen Reactions  . Ace Inhibitors     REACTION: Cough   Current Outpatient Medications on File Prior to Visit  Medication Sig Dispense Refill  . acyclovir  (ZOVIRAX) 200 MG capsule Take by mouth.    Marland Kitchen albuterol (VENTOLIN HFA) 108 (90 Base) MCG/ACT inhaler Inhale 2 puffs every 4-6 hours as needed- rescue inhaler 54 g 4  . aspirin 81 MG EC tablet Take 1 tablet (81 mg total) by mouth daily. Swallow whole. 30 tablet 12  . fluticasone (FLONASE) 50 MCG/ACT nasal spray 1-2 puffs each nostril once daily while needed 48 mL 4  . fluticasone (FLOVENT HFA) 110 MCG/ACT inhaler Inhale 2 puffs into the lungs in the morning and at bedtime. 3 Inhaler 4  . mupirocin ointment (BACTROBAN) 2 % Apply topically 2 (two) times daily.    . vitamin C (ASCORBIC ACID) 500 MG tablet Take 500 mg by mouth daily.       No current facility-administered medications on file prior to visit.   Review of Systems All otherwise neg per pt    Objective:   Physical Exam BP 118/60 (BP Location: Right Arm, Patient Position: Sitting, Cuff Size: Large)   Pulse (!) 57   Temp 98.4 F (36.9 C) (Oral)   Ht 5\' 11"  (1.803 m)   Wt 244 lb (110.7 kg)   SpO2 95%   BMI 34.03 kg/m  VS noted,  Constitutional: Pt appears in NAD HENT: Head: NCAT.  Right Ear: External ear normal.  Left Ear: External ear normal.  Eyes: . Pupils are equal, round, and reactive to light. Conjunctivae and EOM are normal Nose: without d/c or deformity Neck: Neck supple. Gross normal ROM Cardiovascular: Normal rate  and regular rhythm.   Pulmonary/Chest: Effort normal and breath sounds without rales or wheezing.  Abd:  Soft, NT, ND, + BS, no organomegaly Neurological: Pt is alert. At baseline orientation, motor grossly intact Skin: Skin is warm. No rashes, other new lesions, no LE edema Psychiatric: Pt behavior is normal without agitation  All otherwise neg per pt Lab Results  Component Value Date   WBC 6.9 09/01/2019   HGB 14.9 09/01/2019   HCT 43.8 09/01/2019   PLT 205 09/01/2019   GLUCOSE 99 09/01/2019   CHOL 149 09/01/2019   TRIG 94 09/01/2019   HDL 41 09/01/2019   LDLDIRECT 161.8 05/16/2010   LDLCALC  89 09/01/2019   ALT 41 09/01/2019   AST 40 (H) 09/01/2019   NA 138 09/01/2019   K 4.9 09/01/2019   CL 105 09/01/2019   CREATININE 1.20 09/01/2019   BUN 25 09/01/2019   CO2 26 09/01/2019   TSH 1.83 09/01/2019   PSA 1.4 09/01/2019   HGBA1C 5.8 (H) 09/01/2019         Assessment & Plan:

## 2019-09-01 NOTE — Progress Notes (Signed)
Subjective:   Vincent Mckay is a 69 y.o. male who presents for Medicare Annual/Subsequent preventive examination.  Review of Systems    No ROS. Medicare Wellness Visit Cardiac Risk Factors include: advanced age (>40men, >2 women);dyslipidemia;hypertension;male gender;obesity (BMI >30kg/m2)     Objective:    Today's Vitals   09/01/19 0814  BP: (!) 160/80  Pulse: 61  Temp: 98.4 F (36.9 C)  SpO2: 96%  Weight: 244 lb (110.7 kg)  Height: 5\' 11"  (1.803 m)  PainSc: 0-No pain   Body mass index is 34.03 kg/m.  Advanced Directives 09/01/2019  Does Patient Have a Medical Advance Directive? Yes  Type of Paramedic of [place];Living will  Does patient want to make changes to medical advance directive? No - Patient declined  Copy of [place] in Chart? No - copy requested    Current Medications (verified) Outpatient Encounter Medications as of 09/01/2019  Medication Sig  . albuterol (VENTOLIN HFA) 108 (90 Base) MCG/ACT inhaler Inhale 2 puffs every 4-6 hours as needed- rescue inhaler  . aspirin 81 MG EC tablet Take 1 tablet (81 mg total) by mouth daily. Swallow whole.  Marland Kitchen atorvastatin (LIPITOR) 10 MG tablet TAKE 1 TABLET EVERY DAY AT 6PM  . fluticasone (FLONASE) 50 MCG/ACT nasal spray 1-2 puffs each nostril once daily while needed  . fluticasone (FLOVENT HFA) 110 MCG/ACT inhaler Inhale 2 puffs into the lungs in the morning and at bedtime.  Marland Kitchen olmesartan-hydrochlorothiazide (BENICAR HCT) 20-12.5 MG tablet Take 1 tablet by mouth daily.  . vitamin C (ASCORBIC ACID) 500 MG tablet Take 500 mg by mouth daily.    . [DISCONTINUED] chlorpheniramine-HYDROcodone (TUSSIONEX PENNKINETIC ER) 10-8 MG/5ML SUER Take 5 mLs by mouth every 12 (twelve) hours as needed for cough. (Patient not taking: Reported on 06/26/2019)  . [DISCONTINUED] co-enzyme Q-10 50 MG capsule Take 50 mg by mouth daily.   (Patient not taking: Reported on 06/26/2019)  . [DISCONTINUED] Ginkgo  60 MG TABS Take 1 tablet by mouth daily.   (Patient not taking: Reported on 06/26/2019)  . [DISCONTINUED] SOOLANTRA 1 % CREA Use as directed (Patient not taking: Reported on 06/26/2019)   No facility-administered encounter medications on file as of 09/01/2019.    Allergies (verified) Ace inhibitors   History: Past Medical History:  Diagnosis Date  . Asthma   . ASTHMA, UNSPECIFIED, UNSPECIFIED STATUS 10/05/2008  . Chronic rhinitis 11/15/2008  . Cough 11/15/2008  . HAY FEVER 10/22/2006  . Hyperglycemia 09/12/2015  . HYPERLIPIDEMIA 12/01/2007  . HYPERTENSION 03/06/2008  . JAUNDICE 10/22/2006  . SINUSITIS- ACUTE-NOS 12/01/2007   Past Surgical History:  Procedure Laterality Date  . FINGER GANGLION CYST EXCISION     right index  . s/p left bicep tendon rupture  2004  . TONSILLECTOMY     Family History  Problem Relation Age of Onset  . Arthritis Other   . Diabetes Other    Social History   Socioeconomic History  . Marital status: Married    Spouse name: Not on file  . Number of children: 2  . Years of education: Not on file  . Highest education level: Not on file  Occupational History  . 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 Smoking status: Never Smoker  . Smokeless tobacco: Never Used  Substance and Sexual Activity  . Alcohol use: No  . Drug use: No  . Sexual activity: Not on file  Other Topics Concern  . Not on file  Social History Narrative  . Not on file   Social Determinants of Health   Financial Resource Strain: Low Risk   . Difficulty of Paying Living Expenses: Not hard at all  Food Insecurity: No Food Insecurity  . Worried About Charity fundraiser in the Last Year: Never true  . Ran Out of Food in the Last Year: Never true  Transportation Needs: No Transportation Needs  . Lack of Transportation (Medical): No  . Lack of Transportation (Non-Medical): No  Physical Activity: Sufficiently Active  . Days of Exercise per Week: 5 days    . Minutes of Exercise per Session: 30 min  Stress: No Stress Concern Present  . Feeling of Stress : Not at all  Social Connections: Socially Integrated  . Frequency of Communication with Friends and Family: More than three times a week  . Frequency of Social Gatherings with Friends and Family: More than three times a week  . Attends Religious Services: More than 4 times per year  . Active Member of Clubs or Organizations: Yes  . Attends Archivist Meetings: More than 4 times per year  . Marital Status: Married    Tobacco Counseling Counseling given: No   Clinical Intake:  Pre-visit preparation completed: Yes  Pain : No/denies pain Pain Score: 0-No pain     BMI - recorded: 34.03 Nutritional Status: BMI > 30  Obese Nutritional Risks: None Diabetes: No  How often do you need to have someone help you when you read instructions, pamphlets, or other written materials from your doctor or pharmacy?: 1 - Never What is the last grade level you completed in school?: 2 years of college  Diabetic? no  Interpreter Needed?: No  Information entered by :: Baldo Hufnagle N. Hanifah Royse, LPN   Activities of Daily Living In your present state of health, do you have any difficulty performing the following activities: 09/01/2019  Hearing? N  Vision? N  Difficulty concentrating or making decisions? N  Walking or climbing stairs? N  Dressing or bathing? N  Doing errands, shopping? N  Preparing Food and eating ? N  Using the Toilet? N  In the past six months, have you accidently leaked urine? N  Do you have problems with loss of bowel control? N  Managing your Medications? N  Managing your Finances? N  Housekeeping or managing your Housekeeping? N  Some recent data might be hidden    Patient Care Team: Biagio Borg, MD as PCP - General  Indicate any recent Medical Services you may have received from other than [facility] providers in the past year (date may be approximate).      Assessment:   This is a routine wellness examination for Vincent Mckay.  Hearing/Vision screen No exam data present  Dietary issues and exercise activities discussed: Current Exercise Habits: Home exercise routine, Type of exercise: walking;Other - see comments (yard work; owns a Lawyer business), Time (Minutes): 30, Frequency (Times/Week): 5, Weekly Exercise (Minutes/Week): 150, Intensity: Mild, Exercise limited by: respiratory conditions(s)  Goals   None    Depression Screen PHQ 2/9 Scores 09/01/2019 08/31/2018 08/31/2018 08/26/2017 09/25/2016 09/12/2015 09/25/2014  PHQ - 2 Score 0 0 0 0 0 0 0  PHQ- 9 Score - - 0 - - - -    Fall Risk Fall Risk  09/01/2019 08/31/2018 08/26/2017 09/25/2016 09/12/2015  Falls in the past year? 0 0 No No No  Number falls in past yr: 0 - - - -  Injury with Fall? 0 - - - -  Risk for fall due to : No Fall Risks - - - -  Follow up Falls evaluation completed - - - -    Any stairs in or around the home? No  If so, are there any without handrails? No  Home free of loose throw rugs in walkways, pet beds, electrical cords, etc? Yes  Adequate lighting in your home to reduce risk of falls? Yes   ASSISTIVE DEVICES UTILIZED TO PREVENT FALLS:  Life alert? No  Use of a cane, walker or w/c? No  Grab bars in the bathroom? Yes  Shower chair or bench in shower? No  Elevated toilet seat or a handicapped toilet? No   TIMED UP AND GO:  Was the test performed? No .  Length of time to ambulate 10 feet: 0 sec.   Gait steady and fast without use of assistive device  Cognitive Function:     6CIT Screen 09/01/2019  What Year? 0 points  What month? 0 points  What time? 0 points  Count back from 20 0 points  Months in reverse 0 points  Repeat phrase 0 points  Total Score 0    Immunizations Immunization History  Administered Date(s) Administered  . Fluad Quad(high Dose 65+) 08/31/2018  . Influenza Split 09/21/2011  . Influenza Whole 11/15/2008  . Influenza,  High Dose Seasonal PF 10/11/2017  . Influenza,inj,Quad PF,6+ Mos 09/29/2012  . Pneumococcal Conjugate-13 10/03/2015  . Pneumococcal Polysaccharide-23 11/15/2008, 09/25/2016  . Tdap 08/31/2018  . Zoster 09/29/2012    TDAP status: Up to date Flu Vaccine status: Up to date Pneumococcal vaccine status: Up to date Covid-19 vaccine status: Completed vaccines  Qualifies for Shingles Vaccine? Yes   Zostavax completed Yes   Shingrix Completed?: No.    Education has been provided regarding the importance of this vaccine. Patient has been advised to call insurance company to determine out of pocket expense if they have not yet received this vaccine. Advised may also receive vaccine at local pharmacy or Health Dept. Verbalized acceptance and understanding.  Screening Tests Health Maintenance  Topic Date Due  . COVID-19 Vaccine (1) Never done  . COLONOSCOPY  12/12/2015  . INFLUENZA VACCINE  08/13/2019  . TETANUS/TDAP  08/30/2028  . Hepatitis C Screening  Completed  . PNA vac Low Risk Adult  Completed    Health Maintenance  Health Maintenance Due  Topic Date Due  . COVID-19 Vaccine (1) Never done  . COLONOSCOPY  12/12/2015  . INFLUENZA VACCINE  08/13/2019    Colorectal cancer screening: Completed 12/12/2010. Repeat every 5 years  Lung Cancer Screening: (Low Dose CT Chest recommended if Age 29-80 years, 30 pack-year currently smoking OR have quit w/in 15years.) does not qualify.   Lung Cancer Screening Referral: no  Additional Screening:  Hepatitis C Screening: does qualify; Completed: yes  Vision Screening: Recommended annual ophthalmology exams for early detection of glaucoma and other disorders of the eye. Is the patient up to date with their annual eye exam?  Yes  Who is the provider or what is the name of the office in which the patient attends annual eye exams? Aretha Parrot, MD If pt is not established with a provider, would they like to be referred to a provider to establish  care? No .   Dental Screening: Recommended annual dental exams for proper oral hygiene  Community Resource Referral / Chronic Care Management: CRR required this visit?  No   CCM required this visit?  No      Plan:  I have personally reviewed and noted the following in the patient's chart:   . Medical and social history . Use of alcohol, tobacco or illicit drugs  . Current medications and supplements . Functional ability and status . Nutritional status . Physical activity . Advanced directives . List of other physicians . Hospitalizations, surgeries, and ER visits in previous 12 months . Vitals . Screenings to include cognitive, depression, and falls . Referrals and appointments  In addition, I have reviewed and discussed with patient certain preventive protocols, quality metrics, and best practice recommendations. A written personalized care plan for preventive services as well as general preventive health recommendations were provided to patient.     Sheral Flow, LPN   [id number]   Nurse Notes: n/a

## 2019-09-01 NOTE — Patient Instructions (Addendum)
You will be contacted regarding the referral for: colonoscopy ° °Please continue all other medications as before, and refills have been done if requested. ° °Please have the pharmacy call with any other refills you may need. ° °Please continue your efforts at being more active, low cholesterol diet, and weight control. ° °You are otherwise up to date with prevention measures today. ° °Please keep your appointments with your specialists as you may have planned ° °Please go to the LAB at the blood drawing area for the tests to be done ° °You will be contacted by phone if any changes need to be made immediately.  Otherwise, you will receive a letter about your results with an explanation, but please check with MyChart first. ° °Please remember to sign up for MyChart if you have not done so, as this will be important to you in the future with finding out test results, communicating by private email, and scheduling acute appointments online when needed. ° °Please make an Appointment to return for your 1 year visit, or sooner if needed °

## 2019-09-02 ENCOUNTER — Encounter: Payer: Self-pay | Admitting: Internal Medicine

## 2019-09-02 LAB — URINALYSIS, ROUTINE W REFLEX MICROSCOPIC
Bilirubin Urine: NEGATIVE
Glucose, UA: NEGATIVE
Hgb urine dipstick: NEGATIVE
Ketones, ur: NEGATIVE
Leukocytes,Ua: NEGATIVE
Nitrite: NEGATIVE
Protein, ur: NEGATIVE
Specific Gravity, Urine: 1.018 (ref 1.001–1.03)
pH: 6.5 (ref 5.0–8.0)

## 2019-09-02 LAB — LIPID PANEL
Cholesterol: 149 mg/dL (ref <200)
HDL: 41 mg/dL (ref >=40)
LDL Cholesterol (Calc): 89 mg/dL (calc)
Non-HDL Cholesterol (Calc): 108 mg/dL (calc) (ref <130)
Total CHOL/HDL Ratio: 3.6 (calc) (ref <5.0)
Triglycerides: 94 mg/dL (ref <150)

## 2019-09-02 LAB — CBC WITH DIFFERENTIAL/PLATELET
Absolute Monocytes: 849 cells/uL (ref 200–950)
Basophils Absolute: 28 cells/uL (ref 0–200)
Basophils Relative: 0.4 %
Eosinophils Absolute: 290 cells/uL (ref 15–500)
Eosinophils Relative: 4.2 %
HCT: 43.8 % (ref 38.5–50.0)
Hemoglobin: 14.9 g/dL (ref 13.2–17.1)
Lymphs Abs: 1028 cells/uL (ref 850–3900)
MCH: 30.2 pg (ref 27.0–33.0)
MCHC: 34 g/dL (ref 32.0–36.0)
MCV: 88.8 fL (ref 80.0–100.0)
MPV: 10.6 fL (ref 7.5–12.5)
Monocytes Relative: 12.3 %
Neutro Abs: 4706 cells/uL (ref 1500–7800)
Neutrophils Relative %: 68.2 %
Platelets: 205 10*3/uL (ref 140–400)
RBC: 4.93 10*6/uL (ref 4.20–5.80)
RDW: 12.2 % (ref 11.0–15.0)
Total Lymphocyte: 14.9 %
WBC: 6.9 10*3/uL (ref 3.8–10.8)

## 2019-09-02 LAB — PSA: PSA: 1.4 ng/mL (ref <=4.0)

## 2019-09-02 LAB — HEMOGLOBIN A1C
Hgb A1c MFr Bld: 5.8 % of total Hgb — ABNORMAL HIGH (ref <5.7)
Mean Plasma Glucose: 120 (calc)
eAG (mmol/L): 6.6 (calc)

## 2019-09-02 LAB — VITAMIN D 25 HYDROXY (VIT D DEFICIENCY, FRACTURES): Vit D, 25-Hydroxy: 40 ng/mL (ref 30–100)

## 2019-09-02 LAB — VITAMIN B12: Vitamin B-12: 428 pg/mL (ref 200–1100)

## 2019-09-02 LAB — TSH: TSH: 1.83 mIU/L (ref 0.40–4.50)

## 2019-09-02 NOTE — Assessment & Plan Note (Signed)
stable overall by history and exam, recent data reviewed with pt, and pt to continue medical treatment as before,  to f/u any worsening symptoms or concerns  

## 2019-09-02 NOTE — Assessment & Plan Note (Signed)

## 2019-09-26 NOTE — Assessment & Plan Note (Signed)
Had some seasonal Spring drainage, managed with current meds.

## 2019-09-26 NOTE — Assessment & Plan Note (Addendum)
Satisfactory control with occasional need for rescue inhaler. Waiting for advice on Covid booster. Plan- refill current meds

## 2019-10-18 ENCOUNTER — Other Ambulatory Visit (INDEPENDENT_AMBULATORY_CARE_PROVIDER_SITE_OTHER): Payer: Medicare HMO

## 2019-10-18 ENCOUNTER — Other Ambulatory Visit: Payer: Self-pay

## 2019-10-18 DIAGNOSIS — Z23 Encounter for immunization: Secondary | ICD-10-CM | POA: Diagnosis not present

## 2019-12-13 DIAGNOSIS — L570 Actinic keratosis: Secondary | ICD-10-CM | POA: Diagnosis not present

## 2019-12-13 DIAGNOSIS — L718 Other rosacea: Secondary | ICD-10-CM | POA: Diagnosis not present

## 2019-12-13 DIAGNOSIS — D225 Melanocytic nevi of trunk: Secondary | ICD-10-CM | POA: Diagnosis not present

## 2019-12-13 DIAGNOSIS — L578 Other skin changes due to chronic exposure to nonionizing radiation: Secondary | ICD-10-CM | POA: Diagnosis not present

## 2019-12-13 DIAGNOSIS — L821 Other seborrheic keratosis: Secondary | ICD-10-CM | POA: Diagnosis not present

## 2020-06-24 NOTE — Progress Notes (Signed)
HPI male never smoker followed for Asthma, allergic rhinitis, complicated by HBP, ASCVD Office spirometry 12/25/08  ------------------------------------------------------------------------------------ 06/26/19- 70 year old male never smoker followed for Asthma, allergic rhinitis, complicated by HBP,ASCVD, Hyperlipidemia,  Ventolin HFA, Flovent 110 hfa, , Had 2 Moderna Covax Wife had Covid infection but he did not. No recent asthma exacerbation. Seasonal rhinitis in the Spring- controlled.   06/25/20- 69 year old male never smoker followed for Asthma, allergic rhinitis, complicated by HBP,ASCVD, Hyperlipidemia,  Body weight today- 244 lbs -Ventolin HFA, Flovent 110 hfa, flonase Covid vax- 2 Modernz Patient is feeling good overall, no concerns at this time.  ACT score 22 Uncomplicated with no exacerbations this Spring.  ROS-see HPI + = positive Constitutional:   No-   weight loss, night sweats, fevers, chills, fatigue, lassitude. HEENT:   No-  headaches, difficulty swallowing, tooth/dental problems, sore throat,       No-  sneezing, itching, ear ache, nasal congestion, post nasal drip,  CV:  No-   chest pain, orthopnea, PND, swelling in lower extremities, anasarca, dizziness, palpitations Resp: No-   shortness of breath with exertion or at rest.              No-   productive cough,  No non-productive cough,  No- coughing up of blood.              No-   change in color of mucus.  No- wheezing.   Skin: No-   rash or lesions. GI:  No-   heartburn, indigestion, abdominal pain, nausea, vomiting,  GU:  MS:  No-   joint pain or swelling.  . Neuro-     nothing unusual Psych:  No- change in mood or affect. No depression or anxiety.  No memory loss.  OBJ- Physical Exam General- Alert, Oriented, Affect-appropriate, Distress- none acute, overweight Skin- rash-none, lesions- none, excoriation- none Lymphadenopathy- none Head- atraumatic            Eyes- Gross vision intact, PERRLA, conjunctivae  and secretions clear            Ears- Hearing, canals-normal            Nose- Clear, no-Septal dev, mucus, polyps, erosion, perforation             Throat- Mallampati II , mucosa clear , drainage- none, tonsils- atrophic Neck- flexible , trachea midline, no stridor , thyroid nl, carotid no bruit Chest - symmetrical excursion , unlabored           Heart/CV- RRR , no murmur , no gallop  , no rub, nl s1 s2                           - JVD- none , edema- none, stasis changes- none, varices- none           Lung- clear to P&A, wheeze- none, cough- none , dullness-none, rub- none           Chest wall-  Abd- + obese with faint residual periumbilical bruising but no palpable ventral hernia. Br/ Gen/ Rectal- Not done, not indicated Extrem- cyanosis- none, clubbing, none, atrophy- none, strength- nl Neuro- grossly intact to observation

## 2020-06-25 ENCOUNTER — Ambulatory Visit: Payer: Medicare HMO | Admitting: Internal Medicine

## 2020-06-25 ENCOUNTER — Other Ambulatory Visit: Payer: Self-pay

## 2020-06-25 ENCOUNTER — Encounter: Payer: Self-pay | Admitting: Internal Medicine

## 2020-06-25 DIAGNOSIS — D225 Melanocytic nevi of trunk: Secondary | ICD-10-CM | POA: Diagnosis not present

## 2020-06-25 DIAGNOSIS — L814 Other melanin hyperpigmentation: Secondary | ICD-10-CM | POA: Diagnosis not present

## 2020-06-25 DIAGNOSIS — D485 Neoplasm of uncertain behavior of skin: Secondary | ICD-10-CM | POA: Diagnosis not present

## 2020-06-25 DIAGNOSIS — L821 Other seborrheic keratosis: Secondary | ICD-10-CM | POA: Diagnosis not present

## 2020-06-25 DIAGNOSIS — J4540 Moderate persistent asthma, uncomplicated: Secondary | ICD-10-CM

## 2020-06-25 DIAGNOSIS — D1801 Hemangioma of skin and subcutaneous tissue: Secondary | ICD-10-CM | POA: Diagnosis not present

## 2020-06-25 DIAGNOSIS — J310 Chronic rhinitis: Secondary | ICD-10-CM

## 2020-06-25 DIAGNOSIS — L209 Atopic dermatitis, unspecified: Secondary | ICD-10-CM | POA: Diagnosis not present

## 2020-06-25 DIAGNOSIS — L570 Actinic keratosis: Secondary | ICD-10-CM | POA: Diagnosis not present

## 2020-06-25 DIAGNOSIS — L579 Skin changes due to chronic exposure to nonionizing radiation, unspecified: Secondary | ICD-10-CM | POA: Diagnosis not present

## 2020-06-25 DIAGNOSIS — D044 Carcinoma in situ of skin of scalp and neck: Secondary | ICD-10-CM | POA: Diagnosis not present

## 2020-06-25 MED ORDER — ALBUTEROL SULFATE HFA 108 (90 BASE) MCG/ACT IN AERS
INHALATION_SPRAY | RESPIRATORY_TRACT | 4 refills | Status: DC
Start: 2020-06-25 — End: 2021-07-10

## 2020-06-25 MED ORDER — FLUTICASONE PROPIONATE 50 MCG/ACT NA SUSP
NASAL | 4 refills | Status: DC
Start: 2020-06-25 — End: 2021-07-10

## 2020-06-25 MED ORDER — FLOVENT HFA 110 MCG/ACT IN AERO
2.0000 | INHALATION_SPRAY | Freq: Two times a day (BID) | RESPIRATORY_TRACT | 4 refills | Status: DC
Start: 2020-06-25 — End: 2021-07-10

## 2020-06-25 NOTE — Patient Instructions (Signed)
I'm glad you are doing so well! Please call if we can help.  Refills sent for your inhalers

## 2020-08-13 DIAGNOSIS — D044 Carcinoma in situ of skin of scalp and neck: Secondary | ICD-10-CM | POA: Diagnosis not present

## 2020-08-13 DIAGNOSIS — L570 Actinic keratosis: Secondary | ICD-10-CM | POA: Diagnosis not present

## 2020-09-12 ENCOUNTER — Other Ambulatory Visit: Payer: Self-pay | Admitting: Internal Medicine

## 2020-09-12 NOTE — Telephone Encounter (Signed)
Please refill as per office routine med refill policy (all routine meds to be refilled for 3 mo or monthly (per pt preference) up to one year from last visit, then month to month grace period for 3 mo, then further med refills will have to be denied) ? ?

## 2020-09-15 ENCOUNTER — Telehealth: Payer: Self-pay | Admitting: Internal Medicine

## 2020-09-17 MED ORDER — ATORVASTATIN CALCIUM 10 MG PO TABS: ORAL_TABLET | ORAL | 0 refills | Status: DC

## 2020-09-25 ENCOUNTER — Other Ambulatory Visit: Payer: Self-pay | Admitting: Internal Medicine

## 2020-09-25 ENCOUNTER — Encounter: Payer: Self-pay | Admitting: Internal Medicine

## 2020-09-25 ENCOUNTER — Other Ambulatory Visit: Payer: Self-pay

## 2020-09-25 ENCOUNTER — Ambulatory Visit (INDEPENDENT_AMBULATORY_CARE_PROVIDER_SITE_OTHER): Payer: Medicare HMO | Admitting: Internal Medicine

## 2020-09-25 VITALS — BP 138/62 | HR 58 | Temp 98.5°F | Ht 70.0 in | Wt 241.0 lb

## 2020-09-25 DIAGNOSIS — E7800 Pure hypercholesterolemia, unspecified: Secondary | ICD-10-CM | POA: Diagnosis not present

## 2020-09-25 DIAGNOSIS — Z0000 Encounter for general adult medical examination without abnormal findings: Secondary | ICD-10-CM

## 2020-09-25 DIAGNOSIS — E538 Deficiency of other specified B group vitamins: Secondary | ICD-10-CM | POA: Diagnosis not present

## 2020-09-25 DIAGNOSIS — I10 Essential (primary) hypertension: Secondary | ICD-10-CM | POA: Diagnosis not present

## 2020-09-25 DIAGNOSIS — E559 Vitamin D deficiency, unspecified: Secondary | ICD-10-CM

## 2020-09-25 DIAGNOSIS — Z0001 Encounter for general adult medical examination with abnormal findings: Secondary | ICD-10-CM | POA: Diagnosis not present

## 2020-09-25 DIAGNOSIS — R739 Hyperglycemia, unspecified: Secondary | ICD-10-CM | POA: Diagnosis not present

## 2020-09-25 LAB — CBC WITH DIFFERENTIAL/PLATELET
Basophils Absolute: 0 10*3/uL (ref 0.0–0.1)
Basophils Relative: 0.5 % (ref 0.0–3.0)
Eosinophils Absolute: 0.3 10*3/uL (ref 0.0–0.7)
Eosinophils Relative: 4.3 % (ref 0.0–5.0)
HCT: 40.5 % (ref 39.0–52.0)
Hemoglobin: 13.8 g/dL (ref 13.0–17.0)
Lymphocytes Relative: 15.2 % (ref 12.0–46.0)
Lymphs Abs: 1 10*3/uL (ref 0.7–4.0)
MCHC: 34 g/dL (ref 30.0–36.0)
MCV: 87 fl (ref 78.0–100.0)
Monocytes Absolute: 0.8 10*3/uL (ref 0.1–1.0)
Monocytes Relative: 12 % (ref 3.0–12.0)
Neutro Abs: 4.4 10*3/uL (ref 1.4–7.7)
Neutrophils Relative %: 68 % (ref 43.0–77.0)
Platelets: 212 10*3/uL (ref 150.0–400.0)
RBC: 4.66 Mil/uL (ref 4.22–5.81)
RDW: 13.4 % (ref 11.5–15.5)
WBC: 6.5 10*3/uL (ref 4.0–10.5)

## 2020-09-25 LAB — BASIC METABOLIC PANEL
BUN: 27 mg/dL — ABNORMAL HIGH (ref 6–23)
CO2: 23 mEq/L (ref 19–32)
Calcium: 9.9 mg/dL (ref 8.4–10.5)
Chloride: 105 mEq/L (ref 96–112)
Creatinine, Ser: 1.21 mg/dL (ref 0.40–1.50)
GFR: 60.61 mL/min (ref >60.00)
Glucose, Bld: 99 mg/dL (ref 70–99)
Potassium: 4.5 mEq/L (ref 3.5–5.1)
Sodium: 138 mEq/L (ref 135–145)

## 2020-09-25 LAB — HEPATIC FUNCTION PANEL
ALT: 31 U/L (ref 0–53)
AST: 30 U/L (ref 0–37)
Albumin: 4.5 g/dL (ref 3.5–5.2)
Alkaline Phosphatase: 44 U/L (ref 39–117)
Bilirubin, Direct: 0.1 mg/dL (ref 0.0–0.3)
Total Bilirubin: 0.8 mg/dL (ref 0.2–1.2)
Total Protein: 6.9 g/dL (ref 6.0–8.3)

## 2020-09-25 LAB — URINALYSIS, ROUTINE W REFLEX MICROSCOPIC
Bilirubin Urine: NEGATIVE
Hgb urine dipstick: NEGATIVE
Ketones, ur: NEGATIVE
Leukocytes,Ua: NEGATIVE
Nitrite: NEGATIVE
RBC / HPF: NONE SEEN (ref 0–?)
Specific Gravity, Urine: 1.02 (ref 1.000–1.030)
Total Protein, Urine: NEGATIVE
Urine Glucose: NEGATIVE
Urobilinogen, UA: 0.2 (ref 0.0–1.0)
WBC, UA: NONE SEEN (ref 0–?)
pH: 6 (ref 5.0–8.0)

## 2020-09-25 LAB — LIPID PANEL
Cholesterol: 146 mg/dL (ref 0–200)
HDL: 39.7 mg/dL (ref >39.00)
LDL Cholesterol: 85 mg/dL (ref 0–99)
NonHDL: 106.39
Total CHOL/HDL Ratio: 4
Triglycerides: 105 mg/dL (ref 0.0–149.0)
VLDL: 21 mg/dL (ref 0.0–40.0)

## 2020-09-25 LAB — PSA: PSA: 1.93 ng/mL (ref 0.10–4.00)

## 2020-09-25 LAB — TSH: TSH: 1.51 u[IU]/mL (ref 0.35–5.50)

## 2020-09-25 LAB — HEMOGLOBIN A1C: Hgb A1c MFr Bld: 5.8 % (ref 4.6–6.5)

## 2020-09-25 LAB — VITAMIN B12: Vitamin B-12: 453 pg/mL (ref 211–911)

## 2020-09-25 LAB — VITAMIN D 25 HYDROXY (VIT D DEFICIENCY, FRACTURES): VITD: 53.52 ng/mL (ref 30.00–100.00)

## 2020-09-25 MED ORDER — ATORVASTATIN CALCIUM 10 MG PO TABS: ORAL_TABLET | ORAL | 3 refills | Status: DC

## 2020-09-25 MED ORDER — OLMESARTAN MEDOXOMIL-HCTZ 20-12.5 MG PO TABS: 1.0000 | ORAL_TABLET | Freq: Every day | ORAL | 3 refills | Status: DC

## 2020-09-25 NOTE — Progress Notes (Signed)
Patient ID: Vincent Mckay, male   DOB: 01-Sep-1950, 70 y.o.   MRN: [id number]         Chief Complaint:: wellness exam        HPI:  Vincent Mckay is a 70 y.o. male here for wellness exam; declines flu shot, covid booster, shingrx, colonoscopy ow up to date with preventive referrals and immunizations.  Pt denies chest pain, increased sob or doe, wheezing, orthopnea, PND, increased LE swelling, palpitations, dizziness or syncope.   Pt denies polydipsia, polyuria, or new focal neuro s/s. No new complaints    Wt Readings from Last 3 Encounters:  09/25/20 241 lb (109.3 kg)  06/25/20 244 lb 3.2 oz (110.8 kg)  09/01/19 244 lb (110.7 kg)   BP Readings from Last 3 Encounters:  09/25/20 138/62  06/25/20 136/80  09/01/19 118/60   Immunization History  Administered Date(s) Administered   Fluad Quad(high Dose 65+) 08/31/2018, 10/18/2019   Influenza Split 09/21/2011   Influenza Whole 11/15/2008   Influenza, High Dose Seasonal PF 10/11/2017   Influenza,inj,Quad PF,6+ Mos 09/29/2012   Moderna Sars-Covid-2 Vaccination 05/13/2019, 06/05/2019   Pneumococcal Conjugate-13 10/03/2015   Pneumococcal Polysaccharide-23 11/15/2008, 09/25/2016   Tdap 08/31/2018   Zoster, Live 09/29/2012   There are no preventive care reminders to display for this patient.     Past Medical History:  Diagnosis Date   Asthma    ASTHMA, UNSPECIFIED, UNSPECIFIED STATUS 10/05/2008   Chronic rhinitis 11/15/2008   Cough 11/15/2008   HAY FEVER 10/22/2006   Hyperglycemia 09/12/2015   HYPERLIPIDEMIA 12/01/2007   HYPERTENSION 03/06/2008   JAUNDICE 10/22/2006   SINUSITIS- ACUTE-NOS 12/01/2007   Past Surgical History:  Procedure Laterality Date   FINGER GANGLION CYST EXCISION     right index   s/p left bicep tendon rupture  2004   TONSILLECTOMY      reports that he has never smoked. He has never used smokeless tobacco. He reports that he does not drink alcohol and does not use drugs. family history includes Arthritis in an other  family member; Diabetes in an other family member. Allergies  Allergen Reactions   Ace Inhibitors     REACTION: Cough   Current Outpatient Medications on File Prior to Visit  Medication Sig Dispense Refill   acyclovir (ZOVIRAX) 200 MG capsule Take by mouth.     albuterol (VENTOLIN HFA) 108 (90 Base) MCG/ACT inhaler Inhale 2 puffs every 4-6 hours as needed- rescue inhaler 54 g 4   aspirin 81 MG EC tablet Take 1 tablet (81 mg total) by mouth daily. Swallow whole. 30 tablet 12   fluticasone (FLONASE) 50 MCG/ACT nasal spray 1-2 puffs each nostril once daily while needed 48 mL 4   fluticasone (FLOVENT HFA) 110 MCG/ACT inhaler Inhale 2 puffs into the lungs in the morning and at bedtime. 3 each 4   mupirocin ointment (BACTROBAN) 2 % Apply topically 2 (two) times daily.     vitamin C (ASCORBIC ACID) 500 MG tablet Take 500 mg by mouth daily.       No current facility-administered medications on file prior to visit.        ROS:  All others reviewed and negative.  Objective        PE:  BP 138/62 (BP Location: Right Arm, Patient Position: Sitting, Cuff Size: Large)   Pulse (!) 58   Temp 98.5 F (36.9 C) (Oral)   Ht '5\' 10"'$  (1.778 m)   Wt 241 lb (109.3 kg)   SpO2 97%  BMI 34.58 kg/m                 Constitutional: Pt appears in NAD               HENT: Head: NCAT.                Right Ear: External ear normal.                 Left Ear: External ear normal.                Eyes: . Pupils are equal, round, and reactive to light. Conjunctivae and EOM are normal               Nose: without d/c or deformity               Neck: Neck supple. Gross normal ROM               Cardiovascular: Normal rate and regular rhythm.                 Pulmonary/Chest: Effort normal and breath sounds without rales or wheezing.                Abd:  Soft, NT, ND, + BS, no organomegaly               Neurological: Pt is alert. At baseline orientation, motor grossly intact               Skin: Skin is warm. No rashes,  no other new lesions, LE edema - none               Psychiatric: Pt behavior is normal without agitation   Micro: none  Cardiac tracings I have personally interpreted today:  none  Pertinent Radiological findings (summarize): none   Lab Results  Component Value Date   WBC 6.5 09/25/2020   HGB 13.8 09/25/2020   HCT 40.5 09/25/2020   PLT 212.0 09/25/2020   GLUCOSE 99 09/25/2020   CHOL 146 09/25/2020   TRIG 105.0 09/25/2020   HDL 39.70 09/25/2020   LDLDIRECT 161.8 05/16/2010   LDLCALC 85 09/25/2020   ALT 31 09/25/2020   AST 30 09/25/2020   NA 138 09/25/2020   K 4.5 09/25/2020   CL 105 09/25/2020   CREATININE 1.21 09/25/2020   BUN 27 (H) 09/25/2020   CO2 23 09/25/2020   TSH 1.51 09/25/2020   PSA 1.93 09/25/2020   HGBA1C 5.8 09/25/2020   Assessment/Plan:  Vincent Mckay is a 70 y.o. White or Caucasian [1] male with  has a past medical history of Asthma, ASTHMA, UNSPECIFIED, UNSPECIFIED STATUS (10/05/2008), Chronic rhinitis (11/15/2008), Cough (11/15/2008), HAY FEVER (10/22/2006), Hyperglycemia (09/12/2015), HYPERLIPIDEMIA (12/01/2007), HYPERTENSION (03/06/2008), JAUNDICE (10/22/2006), and SINUSITIS- ACUTE-NOS (12/01/2007).  Hyperlipidemia Lab Results  Component Value Date   LDLCALC 85 09/25/2020   Stable, pt to continue current statin lipitor   Hyperglycemia Lab Results  Component Value Date   HGBA1C 5.8 09/25/2020   Stable, pt to continue current medical treatment  - diet   Essential hypertension BP Readings from Last 3 Encounters:  09/25/20 138/62  06/25/20 136/80  09/01/19 118/60   Stable, pt to continue medical treatment benicar hct   Preventative health care .Age and sex appropriate education and counseling updated with regular exercise and diet Referrals for preventative services - declines colonoscopy Immunizations addressed - declines flu shot, shingrix, covid booster Smoking counseling  - none needed Evidence for depression or  other mood disorder - none  significant Most recent labs reviewed. I have personally reviewed and have noted: 1) the patient's medical and social history 2) The patient's current medications and supplements 3) The patient's height, weight, and BMI have been recorded in the chart  Followup: No follow-ups on file.  Cathlean Cower, MD 09/30/2020 8:06 AM [facility]

## 2020-09-25 NOTE — Patient Instructions (Signed)

## 2020-09-26 ENCOUNTER — Encounter: Payer: Self-pay | Admitting: Internal Medicine

## 2020-09-30 NOTE — Assessment & Plan Note (Signed)
Lab Results  Component Value Date   LDLCALC 85 09/25/2020   Stable, pt to continue current statin lipitor

## 2020-09-30 NOTE — Assessment & Plan Note (Addendum)
.  Age and sex appropriate education and counseling updated with regular exercise and diet Referrals for preventative services - declines colonoscopy Immunizations addressed - declines flu shot, shingrix, covid booster Smoking counseling  - none needed Evidence for depression or other mood disorder - none significant Most recent labs reviewed. I have personally reviewed and have noted: 1) the patient's medical and social history 2) The patient's current medications and supplements 3) The patient's height, weight, and BMI have been recorded in the chart

## 2020-09-30 NOTE — Assessment & Plan Note (Signed)
BP Readings from Last 3 Encounters:  09/25/20 138/62  06/25/20 136/80  09/01/19 118/60   Stable, pt to continue medical treatment benicar hct

## 2020-09-30 NOTE — Assessment & Plan Note (Signed)
Lab Results  Component Value Date   HGBA1C 5.8 09/25/2020   Stable, pt to continue current medical treatment  - diet

## 2020-11-17 NOTE — Assessment & Plan Note (Signed)
Better control with no seasonal exacerbation this Spring

## 2020-11-17 NOTE — Assessment & Plan Note (Signed)
Uncomplicated, intermittent now Plan- continue present meds

## 2020-12-18 ENCOUNTER — Other Ambulatory Visit: Payer: Self-pay

## 2020-12-18 ENCOUNTER — Ambulatory Visit (INDEPENDENT_AMBULATORY_CARE_PROVIDER_SITE_OTHER): Payer: Medicare HMO

## 2020-12-18 DIAGNOSIS — Z0000 Encounter for general adult medical examination without abnormal findings: Secondary | ICD-10-CM | POA: Diagnosis not present

## 2020-12-18 NOTE — Progress Notes (Addendum)
I connected with Vincent Mckay today by telephone and verified that I am speaking with the correct person using two identifiers. Location patient: home Location provider: work Persons participating in the virtual visit: patient, provider.   I discussed the limitations, risks, security and privacy concerns of performing an evaluation and management service by telephone and the availability of in person appointments. I also discussed with the patient that there may be a patient responsible charge related to this service. The patient expressed understanding and verbally consented to this telephonic visit.    Interactive audio and video telecommunications were attempted between this provider and patient, however failed, due to patient having technical difficulties OR patient did not have access to video capability.  We continued and completed visit with audio only.  Some vital signs may be absent or patient reported.   Time Spent with patient on telephone encounter: 40 minutes  Subjective:   Vincent Mckay is a 70 y.o. male who presents for Medicare Annual/Subsequent preventive examination.  Review of Systems     Cardiac Risk Factors include: advanced age (>42men, >56 women);dyslipidemia;hypertension;male gender     Objective:    There were no vitals filed for this visit. There is no height or weight on file to calculate BMI.  Advanced Directives 12/18/2020 09/01/2019  Does Patient Have a Medical Advance Directive? Yes Yes  Type of Advance Directive Living will;Healthcare Power of [place];Living will  Does patient want to make changes to medical advance directive? No - Patient declined No - Patient declined  Copy of [place] in Chart? No - copy requested No - copy requested    Current Medications (verified) Outpatient Encounter Medications as of 12/18/2020  Medication Sig   acyclovir (ZOVIRAX) 200 MG capsule Take by mouth.   albuterol  (VENTOLIN HFA) 108 (90 Base) MCG/ACT inhaler Inhale 2 puffs every 4-6 hours as needed- rescue inhaler   aspirin 81 MG EC tablet Take 1 tablet (81 mg total) by mouth daily. Swallow whole.   atorvastatin (LIPITOR) 10 MG tablet TAKE 1 TABLET EVERY DAY AT 6PM   fluticasone (FLONASE) 50 MCG/ACT nasal spray 1-2 puffs each nostril once daily while needed   fluticasone (FLOVENT HFA) 110 MCG/ACT inhaler Inhale 2 puffs into the lungs in the morning and at bedtime.   mupirocin ointment (BACTROBAN) 2 % Apply topically 2 (two) times daily.   olmesartan-hydrochlorothiazide (BENICAR HCT) 20-12.5 MG tablet Take 1 tablet by mouth daily.   vitamin C (ASCORBIC ACID) 500 MG tablet Take 500 mg by mouth daily.     No facility-administered encounter medications on file as of 12/18/2020.    Allergies (verified) Ace inhibitors   History: Past Medical History:  Diagnosis Date   Asthma    ASTHMA, UNSPECIFIED, UNSPECIFIED STATUS 10/05/2008   Chronic rhinitis 11/15/2008   Cough 11/15/2008   HAY FEVER 10/22/2006   Hyperglycemia 09/12/2015   HYPERLIPIDEMIA 12/01/2007   HYPERTENSION 03/06/2008   JAUNDICE 10/22/2006   SINUSITIS- ACUTE-NOS 12/01/2007   Past Surgical History:  Procedure Laterality Date   FINGER GANGLION CYST EXCISION     right index   s/p left bicep tendon rupture  2004   TONSILLECTOMY     Family History  Problem Relation Age of Onset   Arthritis Other    Diabetes Other    Social History   Socioeconomic History   Marital status: Married    Spouse name: Not on file   Number of children: 2   Years of  education: Not on file   Highest education level: Not on file  Occupational History   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  Other Topics Concern   Not on file  Social History  Narrative   Not on file   Social Determinants of Health   Financial Resource Strain: Low Risk    Difficulty of Paying Living Expenses: Not hard at all  Food Insecurity: No Food Insecurity   Worried About Charity fundraiser in the Last Year: Never true   [place] in the Last Year: Never true  Transportation Needs: No Transportation Needs   Lack of Transportation (Medical): No   Lack of Transportation (Non-Medical): No  Physical Activity: Sufficiently Active   Days of Exercise per Week: 5 days   Minutes of Exercise per Session: 30 min  Stress: No Stress Concern Present   Feeling of Stress : Not at all  Social Connections: Socially Integrated   Frequency of Communication with Friends and Family: More than three times a week   Frequency of Social Gatherings with Friends and Family: More than three times a week   Attends Religious Services: More than 4 times per year   Active Member of [employer] or Organizations: Yes   Attends Music therapist: More than 4 times per year   Marital Status: Married    Tobacco Counseling Counseling given: Not Answered   Clinical Intake:  Pre-visit preparation completed: Yes  Pain : No/denies pain     Nutritional Risks: None Diabetes: No  How often do you need to have someone help you when you read instructions, pamphlets, or other written materials from your doctor or pharmacy?: 1 - Never What is the last grade level you completed in school?: Associate's Degree  Diabetic? no  Interpreter Needed?: No  Information entered by :: Lisette Abu, LPN   Activities of Daily Living In your present state of health, do you have any difficulty performing the following activities: 12/18/2020 09/25/2020  Hearing? N N  Vision? N N  Difficulty concentrating or making decisions? N N  Walking or climbing stairs? N N  Dressing or bathing? N N  Doing errands, shopping? N N  Preparing Food and eating ? N -  Using the Toilet? N -  In  the past six months, have you accidently leaked urine? N -  Do you have problems with loss of bowel control? N -  Managing your Medications? N -  Managing your Finances? N -  Housekeeping or managing your Housekeeping? N -  Some recent data might be hidden    Patient Care Team: Biagio Borg, MD as PCP - General  Indicate any recent Medical Services you may have received from other than [facility] providers in the past year (date may be approximate).     Assessment:   This is a routine wellness examination for Lin.  Hearing/Vision screen Hearing Screening - Comments:: Patient denied any hearing difficulty.   No hearing aids.  Vision Screening - Comments:: Patient wears corrective glasses/contacts.  Patient checking vision insurance for in network providers.  Dietary issues and exercise activities discussed: Current Exercise Habits: The patient has a physically strenuous job, but has no regular exercise apart from work., Exercise  limited by: None identified   Goals Addressed   None   Depression Screen PHQ 2/9 Scores 12/18/2020 09/25/2020 09/25/2020 09/01/2019 09/01/2019 08/31/2018 08/31/2018  PHQ - 2 Score 0 0 0 0 0 0 0  PHQ- 9 Score - - - - - - 0    Fall Risk Fall Risk  12/18/2020 09/25/2020 09/25/2020 09/01/2019 09/01/2019  Falls in the past year? 0 0 0 0 0  Number falls in past yr: 0 0 0 - 0  Injury with Fall? 0 0 0 - 0  Risk for fall due to : No Fall Risks - - - No Fall Risks  Follow up Falls prevention discussed - - - Falls evaluation completed    FALL RISK PREVENTION PERTAINING TO THE HOME:  Any stairs in or around the home? No  If so, are there any without handrails? No  Home free of loose throw rugs in walkways, pet beds, electrical cords, etc? Yes  Adequate lighting in your home to reduce risk of falls? Yes   ASSISTIVE DEVICES UTILIZED TO PREVENT FALLS:  Life alert? No  Use of a cane, walker or w/c? No  Grab bars in the bathroom? Yes  Shower chair or bench in shower? Yes   Elevated toilet seat or a handicapped toilet? Yes   TIMED UP AND GO:  Was the test performed? No .  Length of time to ambulate 10 feet: n/a sec.   Gait steady and fast without use of assistive device  Cognitive Function: Normal cognitive status assessed by direct observation by this Nurse Health Advisor. No abnormalities found.       6CIT Screen 09/01/2019  What Year? 0 points  What month? 0 points  What time? 0 points  Count back from 20 0 points  Months in reverse 0 points  Repeat phrase 0 points  Total Score 0    Immunizations Immunization History  Administered Date(s) Administered   Fluad Quad(high Dose 65+) 08/31/2018, 10/18/2019   Influenza Split 09/21/2011   Influenza Whole 11/15/2008   Influenza, High Dose Seasonal PF 10/11/2017   Influenza,inj,Quad PF,6+ Mos 09/29/2012   Moderna Sars-Covid-2 Vaccination 05/13/2019, 06/05/2019   Pneumococcal Conjugate-13 10/03/2015   Pneumococcal Polysaccharide-23 11/15/2008, 09/25/2016   Tdap 08/31/2018   Zoster, Live 09/29/2012    TDAP status: Up to date  Flu Vaccine status: Up to date  Pneumococcal vaccine status: Up to date  Covid-19 vaccine status: Completed vaccines  Qualifies for Shingles Vaccine? Yes   Zostavax completed Yes   Shingrix Completed?: No.    Education has been provided regarding the importance of this vaccine. Patient has been advised to call insurance company to determine out of pocket expense if they have not yet received this vaccine. Advised may also receive vaccine at local pharmacy or Health Dept. Verbalized acceptance and understanding.  Screening Tests Health Maintenance  Topic Date Due   COVID-19 Vaccine (3 - Booster) 07/31/2019   Zoster Vaccines- Shingrix (1 of 2) 12/25/2020 (Originally 02/02/2000)   INFLUENZA VACCINE  04/11/2021 (Originally 08/12/2020)   COLONOSCOPY (Pts 45-18yrs Insurance coverage will need to be confirmed)  09/25/2021 (Originally 12/12/2015)   TETANUS/TDAP  08/30/2028    Pneumonia Vaccine 42+ Years old  Completed   Hepatitis C Screening  Completed   HPV VACCINES  Aged Out    Health Maintenance  Health Maintenance Due  Topic Date Due   COVID-19 Vaccine (3 - Booster) 07/31/2019    Colorectal cancer screening: Type of screening: Colonoscopy. Completed 12/12/2010. Repeat every 5 years (  Postponed until 09/25/2021)  Lung Cancer Screening: (Low Dose CT Chest recommended if Age 69-80 years, 30 pack-year currently smoking OR have quit w/in 15years.) does not qualify.   Lung Cancer Screening Referral: no  Additional Screening:  Hepatitis C Screening: does not qualify; Completed no  Vision Screening: Recommended annual ophthalmology exams for early detection of glaucoma and other disorders of the eye. Is the patient up to date with their annual eye exam?  No  Who is the provider or what is the name of the office in which the patient attends annual eye exams? Patient in process of checking vision coverage for in-network doctors. If pt is not established with a provider, would they like to be referred to a provider to establish care? No .   Dental Screening: Recommended annual dental exams for proper oral hygiene  Community Resource Referral / Chronic Care Management: CRR required this visit?  No   CCM required this visit?  No      Plan:     I have personally reviewed and noted the following in the patient's chart:   Medical and social history Use of alcohol, tobacco or illicit drugs  Current medications and supplements including opioid prescriptions. Patient is not currently taking opioid prescriptions. Functional ability and status Nutritional status Physical activity Advanced directives List of other physicians Hospitalizations, surgeries, and ER visits in previous 12 months Vitals Screenings to include cognitive, depression, and falls Referrals and appointments  In addition, I have reviewed and discussed with patient certain preventive  protocols, quality metrics, and best practice recommendations. A written personalized care plan for preventive services as well as general preventive health recommendations were provided to patient.     Sheral Flow, LPN   16/0/1093   Nurse Notes:  Patient is cogitatively intact. There were no vitals filed for this visit. There is no height or weight on file to calculate BMI. Hearing Screening - Comments:: Patient denied any hearing difficulty.   No hearing aids.  Vision Screening - Comments:: Patient wears corrective glasses/contacts.  Patient checking vision insurance for in network providers.

## 2020-12-25 DIAGNOSIS — L814 Other melanin hyperpigmentation: Secondary | ICD-10-CM | POA: Diagnosis not present

## 2020-12-25 DIAGNOSIS — D235 Other benign neoplasm of skin of trunk: Secondary | ICD-10-CM | POA: Diagnosis not present

## 2020-12-25 DIAGNOSIS — D2261 Melanocytic nevi of right upper limb, including shoulder: Secondary | ICD-10-CM | POA: Diagnosis not present

## 2020-12-25 DIAGNOSIS — L579 Skin changes due to chronic exposure to nonionizing radiation, unspecified: Secondary | ICD-10-CM | POA: Diagnosis not present

## 2020-12-25 DIAGNOSIS — L570 Actinic keratosis: Secondary | ICD-10-CM | POA: Diagnosis not present

## 2020-12-25 DIAGNOSIS — Z85828 Personal history of other malignant neoplasm of skin: Secondary | ICD-10-CM | POA: Diagnosis not present

## 2020-12-25 DIAGNOSIS — L821 Other seborrheic keratosis: Secondary | ICD-10-CM | POA: Diagnosis not present

## 2021-03-26 DIAGNOSIS — L579 Skin changes due to chronic exposure to nonionizing radiation, unspecified: Secondary | ICD-10-CM | POA: Diagnosis not present

## 2021-03-26 DIAGNOSIS — L570 Actinic keratosis: Secondary | ICD-10-CM | POA: Diagnosis not present

## 2021-03-26 DIAGNOSIS — Z85828 Personal history of other malignant neoplasm of skin: Secondary | ICD-10-CM | POA: Diagnosis not present

## 2021-06-25 ENCOUNTER — Ambulatory Visit: Payer: Medicare HMO | Admitting: Internal Medicine

## 2021-07-09 NOTE — Progress Notes (Signed)
HPI male never smoker followed for Asthma, allergic rhinitis, complicated by HBP, ASCVD Office spirometry 12/25/08  ------------------------------------------------------------------------------------  06/25/20- 71 year old male never smoker followed for Asthma, allergic rhinitis, complicated by HBP,ASCVD, Hyperlipidemia,  Body weight today- 244 lbs -Ventolin HFA, Flovent 110 hfa, flonase Covid vax- 2 Modernz Patient is feeling good overall, no concerns at this time.  ACT score 22 Uncomplicated with no exacerbations this Spring.  07/10/21- 71 year old male never smoker followed for Asthma, allergic rhinitis, complicated by HBP,ASCVD, Hyperlipidemia,  Body weight today- 243 lbs -Ventolin HFA, Flovent 110 hfa, flonase Covid vax- 2 Moderna He finds using his Ventolin inhaler 1 puff, 1 time daily as a maintenance control works very well for him.  Does continue Flovent.  No exacerbation or significant concern.  ROS-see HPI + = positive Constitutional:   No-   weight loss, night sweats, fevers, chills, fatigue, lassitude. HEENT:   No-  headaches, difficulty swallowing, tooth/dental problems, sore throat,       No-  sneezing, itching, ear ache, nasal congestion, post nasal drip,  CV:  No-   chest pain, orthopnea, PND, swelling in lower extremities, anasarca, dizziness, palpitations Resp: No-   shortness of breath with exertion or at rest.              No-   productive cough,  No non-productive cough,  No- coughing up of blood.              No-   change in color of mucus.  No- wheezing.   Skin: No-   rash or lesions. GI:  No-   heartburn, indigestion, abdominal pain, nausea, vomiting,  GU:  MS:  No-   joint pain or swelling.  . Neuro-     nothing unusual Psych:  No- change in mood or affect. No depression or anxiety.  No memory loss.  OBJ- Physical Exam General- Alert, Oriented, Affect-appropriate, Distress- none acute, overweight Skin- rash-none, lesions- none, excoriation-  none Lymphadenopathy- none Head- atraumatic            Eyes- Gross vision intact, PERRLA, conjunctivae and secretions clear            Ears- Hearing, canals-normal            Nose- Clear, no-Septal dev, mucus, polyps, erosion, perforation             Throat- Mallampati II , mucosa clear , drainage- none, tonsils- atrophic Neck- flexible , trachea midline, no stridor , thyroid nl, carotid no bruit Chest - symmetrical excursion , unlabored           Heart/CV- RRR , no murmur , no gallop  , no rub, nl s1 s2                           - JVD- none , edema- none, stasis changes- none, varices- none           Lung- clear to P&A, wheeze- none, cough- none , dullness-none, rub- none           Chest wall-  Abd- + obese  Br/ Gen/ Rectal- Not done, not indicated Extrem- cyanosis- none, clubbing, none, atrophy- none, strength- nl Neuro- grossly intact to observation

## 2021-07-10 ENCOUNTER — Encounter: Payer: Self-pay | Admitting: Internal Medicine

## 2021-07-10 ENCOUNTER — Ambulatory Visit: Payer: Medicare HMO | Admitting: Internal Medicine

## 2021-07-10 DIAGNOSIS — J310 Chronic rhinitis: Secondary | ICD-10-CM

## 2021-07-10 DIAGNOSIS — J4540 Moderate persistent asthma, uncomplicated: Secondary | ICD-10-CM | POA: Diagnosis not present

## 2021-07-10 MED ORDER — ALBUTEROL SULFATE HFA 108 (90 BASE) MCG/ACT IN AERS: INHALATION_SPRAY | RESPIRATORY_TRACT | 4 refills | Status: DC

## 2021-07-10 MED ORDER — FLUTICASONE PROPIONATE HFA 110 MCG/ACT IN AERO: 2.0000 | INHALATION_SPRAY | Freq: Two times a day (BID) | RESPIRATORY_TRACT | 12 refills | Status: DC

## 2021-07-10 MED ORDER — FLUTICASONE PROPIONATE 50 MCG/ACT NA SUSP: NASAL | 12 refills | Status: DC

## 2021-07-10 NOTE — Patient Instructions (Signed)
Meds refilled for a year  Glad you are doing well.  Please call if we can help.

## 2021-08-15 ENCOUNTER — Encounter: Payer: Self-pay | Admitting: Internal Medicine

## 2021-08-15 NOTE — Assessment & Plan Note (Signed)
No seasonal flare this far in first half of the year.  Antihistamines and Flonase available if needed.

## 2021-08-15 NOTE — Assessment & Plan Note (Signed)
Uncomplicated with good control using Flovent and 1 puff daily albuterol inhaler. Plan-no reason to change.  Continue current meds.

## 2021-08-28 ENCOUNTER — Other Ambulatory Visit: Payer: Self-pay | Admitting: Internal Medicine

## 2021-08-28 NOTE — Telephone Encounter (Signed)
Please refill as per office routine med refill policy (all routine meds to be refilled for 3 mo or monthly (per pt preference) up to one year from last visit, then month to month grace period for 3 mo, then further med refills will have to be denied) ? ?

## 2021-09-07 ENCOUNTER — Other Ambulatory Visit: Payer: Self-pay | Admitting: Internal Medicine

## 2021-09-07 NOTE — Telephone Encounter (Signed)
Please refill as per office routine med refill policy (all routine meds to be refilled for 3 mo or monthly (per pt preference) up to one year from last visit, then month to month grace period for 3 mo, then further med refills will have to be denied) ? ?

## 2021-09-22 ENCOUNTER — Other Ambulatory Visit (INDEPENDENT_AMBULATORY_CARE_PROVIDER_SITE_OTHER): Payer: Medicare HMO

## 2021-09-22 DIAGNOSIS — E559 Vitamin D deficiency, unspecified: Secondary | ICD-10-CM

## 2021-09-22 DIAGNOSIS — R739 Hyperglycemia, unspecified: Secondary | ICD-10-CM

## 2021-09-22 DIAGNOSIS — Z0000 Encounter for general adult medical examination without abnormal findings: Secondary | ICD-10-CM | POA: Diagnosis not present

## 2021-09-22 DIAGNOSIS — E538 Deficiency of other specified B group vitamins: Secondary | ICD-10-CM

## 2021-09-22 DIAGNOSIS — E7800 Pure hypercholesterolemia, unspecified: Secondary | ICD-10-CM | POA: Diagnosis not present

## 2021-09-22 LAB — CBC WITH DIFFERENTIAL/PLATELET
Basophils Absolute: 0 10*3/uL (ref 0.0–0.1)
Basophils Relative: 0.5 % (ref 0.0–3.0)
Eosinophils Absolute: 0.3 10*3/uL (ref 0.0–0.7)
Eosinophils Relative: 4.5 % (ref 0.0–5.0)
HCT: 39.8 % (ref 39.0–52.0)
Hemoglobin: 13.7 g/dL (ref 13.0–17.0)
Lymphocytes Relative: 11.2 % — ABNORMAL LOW (ref 12.0–46.0)
Lymphs Abs: 0.8 10*3/uL (ref 0.7–4.0)
MCHC: 34.5 g/dL (ref 30.0–36.0)
MCV: 85.5 fl (ref 78.0–100.0)
Monocytes Absolute: 0.7 10*3/uL (ref 0.1–1.0)
Monocytes Relative: 10.4 % (ref 3.0–12.0)
Neutro Abs: 5.3 10*3/uL (ref 1.4–7.7)
Neutrophils Relative %: 73.4 % (ref 43.0–77.0)
Platelets: 203 10*3/uL (ref 150.0–400.0)
RBC: 4.65 Mil/uL (ref 4.22–5.81)
RDW: 13.6 % (ref 11.5–15.5)
WBC: 7.2 10*3/uL (ref 4.0–10.5)

## 2021-09-22 LAB — BASIC METABOLIC PANEL
BUN: 27 mg/dL — ABNORMAL HIGH (ref 6–23)
CO2: 23 mEq/L (ref 19–32)
Calcium: 9.3 mg/dL (ref 8.4–10.5)
Chloride: 108 mEq/L (ref 96–112)
Creatinine, Ser: 1.4 mg/dL (ref 0.40–1.50)
GFR: 50.52 mL/min — ABNORMAL LOW (ref >60.00)
Glucose, Bld: 107 mg/dL — ABNORMAL HIGH (ref 70–99)
Potassium: 4.3 mEq/L (ref 3.5–5.1)
Sodium: 139 mEq/L (ref 135–145)

## 2021-09-22 LAB — VITAMIN B12: Vitamin B-12: 298 pg/mL (ref 211–911)

## 2021-09-22 LAB — URINALYSIS, ROUTINE W REFLEX MICROSCOPIC
Bilirubin Urine: NEGATIVE
Hgb urine dipstick: NEGATIVE
Ketones, ur: NEGATIVE
Leukocytes,Ua: NEGATIVE
Nitrite: NEGATIVE
RBC / HPF: NONE SEEN (ref 0–?)
Specific Gravity, Urine: 1.02 (ref 1.000–1.030)
Total Protein, Urine: NEGATIVE
Urine Glucose: NEGATIVE
Urobilinogen, UA: 0.2 (ref 0.0–1.0)
pH: 6 (ref 5.0–8.0)

## 2021-09-22 LAB — LIPID PANEL
Cholesterol: 129 mg/dL (ref 0–200)
HDL: 32.9 mg/dL — ABNORMAL LOW (ref >39.00)
LDL Cholesterol: 74 mg/dL (ref 0–99)
NonHDL: 96.4
Total CHOL/HDL Ratio: 4
Triglycerides: 113 mg/dL (ref 0.0–149.0)
VLDL: 22.6 mg/dL (ref 0.0–40.0)

## 2021-09-22 LAB — HEPATIC FUNCTION PANEL
ALT: 31 U/L (ref 0–53)
AST: 29 U/L (ref 0–37)
Albumin: 4 g/dL (ref 3.5–5.2)
Alkaline Phosphatase: 47 U/L (ref 39–117)
Bilirubin, Direct: 0.1 mg/dL (ref 0.0–0.3)
Total Bilirubin: 0.6 mg/dL (ref 0.2–1.2)
Total Protein: 6.4 g/dL (ref 6.0–8.3)

## 2021-09-22 LAB — VITAMIN D 25 HYDROXY (VIT D DEFICIENCY, FRACTURES): VITD: 46.29 ng/mL (ref 30.00–100.00)

## 2021-09-22 LAB — TSH: TSH: 0.99 u[IU]/mL (ref 0.35–5.50)

## 2021-09-22 LAB — HEMOGLOBIN A1C: Hgb A1c MFr Bld: 6 % (ref 4.6–6.5)

## 2021-09-22 LAB — PSA: PSA: 2.13 ng/mL (ref 0.10–4.00)

## 2021-09-23 DIAGNOSIS — D0439 Carcinoma in situ of skin of other parts of face: Secondary | ICD-10-CM | POA: Diagnosis not present

## 2021-09-23 DIAGNOSIS — L821 Other seborrheic keratosis: Secondary | ICD-10-CM | POA: Diagnosis not present

## 2021-09-23 DIAGNOSIS — D485 Neoplasm of uncertain behavior of skin: Secondary | ICD-10-CM | POA: Diagnosis not present

## 2021-09-23 DIAGNOSIS — L579 Skin changes due to chronic exposure to nonionizing radiation, unspecified: Secondary | ICD-10-CM | POA: Diagnosis not present

## 2021-09-23 DIAGNOSIS — L814 Other melanin hyperpigmentation: Secondary | ICD-10-CM | POA: Diagnosis not present

## 2021-09-23 DIAGNOSIS — L301 Dyshidrosis [pompholyx]: Secondary | ICD-10-CM | POA: Diagnosis not present

## 2021-09-23 DIAGNOSIS — D235 Other benign neoplasm of skin of trunk: Secondary | ICD-10-CM | POA: Diagnosis not present

## 2021-09-23 DIAGNOSIS — D225 Melanocytic nevi of trunk: Secondary | ICD-10-CM | POA: Diagnosis not present

## 2021-09-23 DIAGNOSIS — L570 Actinic keratosis: Secondary | ICD-10-CM | POA: Diagnosis not present

## 2021-09-23 DIAGNOSIS — L739 Follicular disorder, unspecified: Secondary | ICD-10-CM | POA: Diagnosis not present

## 2021-09-23 DIAGNOSIS — Z85828 Personal history of other malignant neoplasm of skin: Secondary | ICD-10-CM | POA: Diagnosis not present

## 2021-09-23 DIAGNOSIS — L209 Atopic dermatitis, unspecified: Secondary | ICD-10-CM | POA: Diagnosis not present

## 2021-09-26 ENCOUNTER — Ambulatory Visit (INDEPENDENT_AMBULATORY_CARE_PROVIDER_SITE_OTHER): Payer: Medicare HMO | Admitting: Internal Medicine

## 2021-09-26 VITALS — BP 148/66 | HR 61 | Temp 98.1°F | Ht 70.0 in | Wt 242.0 lb

## 2021-09-26 DIAGNOSIS — Z86010 Personal history of colonic polyps: Secondary | ICD-10-CM | POA: Diagnosis not present

## 2021-09-26 DIAGNOSIS — I10 Essential (primary) hypertension: Secondary | ICD-10-CM | POA: Diagnosis not present

## 2021-09-26 DIAGNOSIS — E538 Deficiency of other specified B group vitamins: Secondary | ICD-10-CM

## 2021-09-26 DIAGNOSIS — Z0001 Encounter for general adult medical examination with abnormal findings: Secondary | ICD-10-CM

## 2021-09-26 DIAGNOSIS — E559 Vitamin D deficiency, unspecified: Secondary | ICD-10-CM | POA: Diagnosis not present

## 2021-09-26 DIAGNOSIS — R739 Hyperglycemia, unspecified: Secondary | ICD-10-CM | POA: Diagnosis not present

## 2021-09-26 DIAGNOSIS — Z125 Encounter for screening for malignant neoplasm of prostate: Secondary | ICD-10-CM

## 2021-09-26 DIAGNOSIS — E7800 Pure hypercholesterolemia, unspecified: Secondary | ICD-10-CM | POA: Diagnosis not present

## 2021-09-26 MED ORDER — ATORVASTATIN CALCIUM 10 MG PO TABS: ORAL_TABLET | ORAL | 3 refills | Status: DC

## 2021-09-26 MED ORDER — OLMESARTAN MEDOXOMIL-HCTZ 20-12.5 MG PO TABS: 1.0000 | ORAL_TABLET | Freq: Every day | ORAL | 3 refills | Status: DC

## 2021-09-26 NOTE — Progress Notes (Unsigned)
Patient ID: Vincent Mckay, male   DOB: 14-Jun-1950, 71 y.o.   MRN: [id number]         Chief Complaint:: wellness exam and htn, hyperglycemia, hld       HPI:  Vincent Mckay is a 71 y.o. male here for wellness exam; decliens covid booster, but for flu shot, colonoscopy o/w up to date                        Also Pt denies chest pain, increased sob or doe, wheezing, orthopnea, PND, increased LE swelling, palpitations, dizziness or syncope.   Pt denies polydipsia, polyuria, or new focal neuro s/s.    Pt denies fever, wt loss, night sweats, loss of appetite, or other constitutional symptoms   Wt about the same.  Wt Readings from Last 3 Encounters:  09/26/21 242 lb (109.8 kg)  07/10/21 243 lb 6.4 oz (110.4 kg)  09/25/20 241 lb (109.3 kg)   BP Readings from Last 3 Encounters:  09/26/21 (!) 148/66  07/10/21 126/60  09/25/20 138/62   Immunization History  Administered Date(s) Administered   Fluad Quad(high Dose 65+) 08/31/2018, 10/18/2019   Influenza Split 09/21/2011   Influenza Whole 11/15/2008   Influenza, High Dose Seasonal PF 10/11/2017   Influenza,inj,Quad PF,6+ Mos 09/29/2012   Moderna Sars-Covid-2 Vaccination 05/13/2019, 06/05/2019   Pneumococcal Conjugate-13 10/03/2015   Pneumococcal Polysaccharide-23 11/15/2008, 09/25/2016   Tdap 08/31/2018   Zoster Recombinat (Shingrix) 10/14/2020, 12/12/2020   Zoster, Live 09/29/2012  There are no preventive care reminders to display for this patient.    Past Medical History:  Diagnosis Date   Asthma    ASTHMA, UNSPECIFIED, UNSPECIFIED STATUS 10/05/2008   Chronic rhinitis 11/15/2008   Cough 11/15/2008   HAY FEVER 10/22/2006   Hyperglycemia 09/12/2015   HYPERLIPIDEMIA 12/01/2007   HYPERTENSION 03/06/2008   JAUNDICE 10/22/2006   SINUSITIS- ACUTE-NOS 12/01/2007   Past Surgical History:  Procedure Laterality Date   FINGER GANGLION CYST EXCISION     right index   s/p left bicep tendon rupture  2004   TONSILLECTOMY      reports that he has never  smoked. He has never used smokeless tobacco. He reports that he does not drink alcohol and does not use drugs. family history includes Arthritis in an other family member; Diabetes in an other family member. Allergies  Allergen Reactions   Ace Inhibitors     REACTION: Cough   Current Outpatient Medications on File Prior to Visit  Medication Sig Dispense Refill   acyclovir (ZOVIRAX) 200 MG capsule Take by mouth.     albuterol (VENTOLIN HFA) 108 (90 Base) MCG/ACT inhaler Inhale 2 puffs every 4-6 hours as needed- rescue inhaler 54 g 4   aspirin 81 MG EC tablet Take 1 tablet (81 mg total) by mouth daily. Swallow whole. 30 tablet 12   fluticasone (FLONASE) 50 MCG/ACT nasal spray 1-2 puffs each nostril once daily while needed 16 mL 12   fluticasone (FLOVENT HFA) 110 MCG/ACT inhaler Inhale 2 puffs into the lungs in the morning and at bedtime. 1 each 12   mupirocin ointment (BACTROBAN) 2 % Apply topically 2 (two) times daily.     vitamin C (ASCORBIC ACID) 500 MG tablet Take 500 mg by mouth daily.       No current facility-administered medications on file prior to visit.        ROS:  All others reviewed and negative.  Objective  PE:  BP (!) 148/66 (BP Location: Left Arm, Patient Position: Sitting, Cuff Size: Large)   Pulse 61   Temp 98.1 F (36.7 C) (Oral)   Ht '5\' 10"'$  (1.778 m)   Wt 242 lb (109.8 kg)   SpO2 93%   BMI 34.72 kg/m                 Constitutional: Pt appears in NAD               HENT: Head: NCAT.                Right Ear: External ear normal.                 Left Ear: External ear normal.                Eyes: . Pupils are equal, round, and reactive to light. Conjunctivae and EOM are normal               Nose: without d/c or deformity               Neck: Neck supple. Gross normal ROM               Cardiovascular: Normal rate and regular rhythm.                 Pulmonary/Chest: Effort normal and breath sounds without rales or wheezing.                Abd:  Soft, NT,  ND, + BS, no organomegaly               Neurological: Pt is alert. At baseline orientation, motor grossly intact               Skin: Skin is warm. No rashes, no other new lesions, LE edema - none               Psychiatric: Pt behavior is normal without agitation   Micro: none  Cardiac tracings I have personally interpreted today:  none  Pertinent Radiological findings (summarize): none   Lab Results  Component Value Date   WBC 7.2 09/22/2021   HGB 13.7 09/22/2021   HCT 39.8 09/22/2021   PLT 203.0 09/22/2021   GLUCOSE 107 (H) 09/22/2021   CHOL 129 09/22/2021   TRIG 113.0 09/22/2021   HDL 32.90 (L) 09/22/2021   LDLDIRECT 161.8 05/16/2010   LDLCALC 74 09/22/2021   ALT 31 09/22/2021   AST 29 09/22/2021   NA 139 09/22/2021   K 4.3 09/22/2021   CL 108 09/22/2021   CREATININE 1.40 09/22/2021   BUN 27 (H) 09/22/2021   CO2 23 09/22/2021   TSH 0.99 09/22/2021   PSA 2.13 09/22/2021   HGBA1C 6.0 09/22/2021   Assessment/Plan:  Vincent Mckay is a 71 y.o. White or Caucasian [1] male with  has a past medical history of Asthma, ASTHMA, UNSPECIFIED, UNSPECIFIED STATUS (10/05/2008), Chronic rhinitis (11/15/2008), Cough (11/15/2008), HAY FEVER (10/22/2006), Hyperglycemia (09/12/2015), HYPERLIPIDEMIA (12/01/2007), HYPERTENSION (03/06/2008), JAUNDICE (10/22/2006), and SINUSITIS- ACUTE-NOS (12/01/2007).  Encounter for well adult exam with abnormal findings Age and sex appropriate education and counseling updated with regular exercise and diet Referrals for preventative services - for colonoscopy Immunizations addressed - declines covid booster, for flu shot today Smoking counseling  - none needed Evidence for depression or other mood disorder - none significant Most recent labs reviewed. I have personally reviewed and have noted: 1) the patient's medical and social  history 2) The patient's current medications and supplements 3) The patient's height, weight, and BMI have been recorded in the  chart   Essential hypertension BP Readings from Last 3 Encounters:  09/26/21 (!) 148/66  07/10/21 126/60  09/25/20 138/62   uncontrolled, pt to continue medical treatment benicar hct 20-12.5 mg qd as declines change in tx for now, will continue to monitor at home and next visit   Hyperglycemia Lab Results  Component Value Date   HGBA1C 6.0 09/22/2021   Stable, pt to continue current medical treatment  - diet, wt control, excercise   Hyperlipidemia Lab Results  Component Value Date   [place] 74 09/22/2021   Uncontrolled, goal ldl < 70, pt to continue current statin lipitor 10 mg qd as declines change, and for lower chol diet  Followup: Return in about 1 year (around 09/27/2022).  Cathlean Cower, MD 09/29/2021 9:10 PM [facility]

## 2021-09-26 NOTE — Patient Instructions (Signed)
Please check your BP once per day at home for the next wk, and call if more > 140/90  You had the flu shot today  Please continue all other medications as before, and refills have been done if requested.  Please have the pharmacy call with any other refills you may need.  Please continue your efforts at being more active, low cholesterol diet, and weight control.  You are otherwise up to date with prevention measures today.  Please keep your appointments with your specialists as you may have planned  You will be contacted regarding the referral for: colonoscopy  Please make an Appointment to return for your 1 year visit, or sooner if needed, with Lab testing by Appointment as well, to be done about 3-5 days before at the [facility] (so this is for TWO appointments - please see the scheduling desk as you leave)

## 2021-09-29 ENCOUNTER — Encounter: Payer: Self-pay | Admitting: Internal Medicine

## 2021-09-29 NOTE — Assessment & Plan Note (Signed)
Lab Results  Component Value Date   HGBA1C 6.0 09/22/2021   Stable, pt to continue current medical treatment  - diet, wt control, excercise

## 2021-09-29 NOTE — Assessment & Plan Note (Signed)
BP Readings from Last 3 Encounters:  09/26/21 (!) 148/66  07/10/21 126/60  09/25/20 138/62   uncontrolled, pt to continue medical treatment benicar hct 20-12.5 mg qd as declines change in tx for now, will continue to monitor at home and next visit

## 2021-09-29 NOTE — Assessment & Plan Note (Signed)
Lab Results  Component Value Date   LDLCALC 74 09/22/2021   Uncontrolled, goal ldl < 70, pt to continue current statin lipitor 10 mg qd as declines change, and for lower chol diet

## 2021-09-29 NOTE — Assessment & Plan Note (Addendum)
Age and sex appropriate education and counseling updated with regular exercise and diet Referrals for preventative services - for colonoscopy Immunizations addressed - declines covid booster, for flu shot today Smoking counseling  - none needed Evidence for depression or other mood disorder - none significant Most recent labs reviewed. I have personally reviewed and have noted: 1) the patient's medical and social history 2) The patient's current medications and supplements 3) The patient's height, weight, and BMI have been recorded in the chart

## 2021-10-22 DIAGNOSIS — L570 Actinic keratosis: Secondary | ICD-10-CM | POA: Diagnosis not present

## 2021-10-22 DIAGNOSIS — D0439 Carcinoma in situ of skin of other parts of face: Secondary | ICD-10-CM | POA: Diagnosis not present

## 2021-11-26 DIAGNOSIS — H2513 Age-related nuclear cataract, bilateral: Secondary | ICD-10-CM | POA: Diagnosis not present

## 2021-12-11 ENCOUNTER — Telehealth: Payer: Self-pay | Admitting: Internal Medicine

## 2021-12-11 NOTE — Telephone Encounter (Signed)
Lvm for pt to rtn my call to schedule AWV with [facility] call back # [phone]

## 2021-12-23 DIAGNOSIS — Z85828 Personal history of other malignant neoplasm of skin: Secondary | ICD-10-CM | POA: Diagnosis not present

## 2021-12-23 DIAGNOSIS — L814 Other melanin hyperpigmentation: Secondary | ICD-10-CM | POA: Diagnosis not present

## 2021-12-23 DIAGNOSIS — D225 Melanocytic nevi of trunk: Secondary | ICD-10-CM | POA: Diagnosis not present

## 2021-12-23 DIAGNOSIS — D235 Other benign neoplasm of skin of trunk: Secondary | ICD-10-CM | POA: Diagnosis not present

## 2021-12-23 DIAGNOSIS — L579 Skin changes due to chronic exposure to nonionizing radiation, unspecified: Secondary | ICD-10-CM | POA: Diagnosis not present

## 2021-12-23 DIAGNOSIS — L209 Atopic dermatitis, unspecified: Secondary | ICD-10-CM | POA: Diagnosis not present

## 2021-12-23 DIAGNOSIS — L820 Inflamed seborrheic keratosis: Secondary | ICD-10-CM | POA: Diagnosis not present

## 2021-12-23 DIAGNOSIS — L570 Actinic keratosis: Secondary | ICD-10-CM | POA: Diagnosis not present

## 2021-12-23 DIAGNOSIS — L853 Xerosis cutis: Secondary | ICD-10-CM | POA: Diagnosis not present

## 2021-12-23 DIAGNOSIS — L821 Other seborrheic keratosis: Secondary | ICD-10-CM | POA: Diagnosis not present

## 2022-01-06 ENCOUNTER — Telehealth: Payer: Self-pay | Admitting: Internal Medicine

## 2022-01-06 MED ORDER — ARNUITY ELLIPTA 100 MCG/ACT IN AEPB: 1.0000 | INHALATION_SPRAY | Freq: Every day | RESPIRATORY_TRACT | 12 refills | Status: DC

## 2022-01-06 NOTE — Telephone Encounter (Signed)
Called Pt to let him know that Dr Annamaria Boots sent in a Rx for [place] 100 to the [facility] on [address]. Pt stated understanding and nothing further needed.

## 2022-01-06 NOTE — Telephone Encounter (Signed)
Please send Arnuity 100    # 1, ref x 12   inhale 1 puff then rinse mouth, once daily

## 2022-01-14 ENCOUNTER — Telehealth: Payer: Self-pay | Admitting: Internal Medicine

## 2022-01-14 ENCOUNTER — Other Ambulatory Visit: Payer: Self-pay

## 2022-01-14 MED ORDER — FLUTICASONE PROPIONATE HFA 110 MCG/ACT IN AERO: 2.0000 | INHALATION_SPRAY | Freq: Two times a day (BID) | RESPIRATORY_TRACT | 12 refills | Status: DC

## 2022-01-14 NOTE — Telephone Encounter (Signed)
PT said we called in a RX to replace his Flovent HSA on the last Telephone encounter but it is not working well for him. The Pharm told him there is a generic version of Flovent HSA, he does not know the name of it, but he would like [place] to send that in for him. Please call to advise @ [phone]  Pharm: [facility] [address]

## 2022-01-14 NOTE — Telephone Encounter (Signed)
Ok to send Rxx for Generic Flovent 110    ref prn, inhale 2 puffs then rinse mouth, twice daily

## 2022-01-14 NOTE — Telephone Encounter (Signed)
Called patient sent generic prescription of flovent to pharmacy. Pt verbalized understanding. Nothing further needed.

## 2022-01-15 ENCOUNTER — Telehealth: Payer: Self-pay | Admitting: Internal Medicine

## 2022-01-16 NOTE — Telephone Encounter (Signed)
Patient called in regards to Flovent prescription per his pharmacy they need a formula exception to be started by Dr. Annamaria Boots a request needs to be sent stating the patient has tried other inhalers that were not effective. Insurance states this should reduce the flovent medication from level 4 -2 and this will make it less expensive for the patient. I insurance is stating this only way to get the generic of Flovent.   Dr. Annamaria Boots please advise

## 2022-01-28 ENCOUNTER — Encounter: Payer: Self-pay | Admitting: Internal Medicine

## 2022-01-28 MED ORDER — ASMANEX HFA 200 MCG/ACT IN AERO: 1.0000 | INHALATION_SPRAY | Freq: Two times a day (BID) | RESPIRATORY_TRACT | 3 refills | Status: DC

## 2022-01-28 NOTE — Telephone Encounter (Signed)
Patient checking on message for generic Flovent. Patient phone number is [phone].

## 2022-01-28 NOTE — Telephone Encounter (Signed)
Called and spoke with patient.  Patient stated he was on Flovent HFA, but has been stopped by manufacturer. Patient stated Dr. Annamaria Boots sent in patient Arnuity, but it caused patient to have a cough.  Dr. Annamaria Boots sent in [place], but patient stated his insurance does not cover it.  Patient stated he was speaking with his insurance tomorrow about continuing Flovent generic Fluticasone. Patient requested options. Advised patient I could send a message to our pharmacy team to check on preferred inhalers for Dr. Annamaria Boots to choode from.  I also advised patient to call office with information he receives from [employer].  Message routed to Pharmacy Team to advise on Flovent HFA alternatives

## 2022-01-28 NOTE — Telephone Encounter (Signed)
His insurance requires he try alternatives to Flovent.  Please send Asmanex 200   inhale 2 puffs then rinse mouth, twice daily   refill prn

## 2022-01-28 NOTE — Telephone Encounter (Signed)
Called Pt and let him know that Dr Annamaria Boots is sending in an alternative to the [employer] [employer]. Pt stated that he would see how much this inhaler costs and if too much then he was not going to go pick up. I sent a Rx for Asmanex 200 to his pharmacy. Pt stated understanding and nothing further needed.

## 2022-01-29 ENCOUNTER — Telehealth: Payer: Self-pay

## 2022-01-29 NOTE — Telephone Encounter (Signed)
Expedited PA has been submitted for generic Flovent HFA, awaiting response. Will update in additional encounter as well.

## 2022-01-29 NOTE — Telephone Encounter (Signed)
PA request received via providers office for Fluticasone Propionate HFA 110MCG/ACT aerosol  PA has been submitted through [facility] and APPROVED from 01/29/2022-01/12/2023  Key: [id number]

## 2022-02-26 ENCOUNTER — Telehealth: Payer: Self-pay

## 2022-02-26 NOTE — Telephone Encounter (Signed)
Left message for patient to call back to schedule Medicare Annual Wellness Visit   Last AWV  12/18/20  Please schedule at anytime with [facility] if patient calls the office back.    30 Minutes appointment   Any questions, please call me at [phone]

## 2022-04-15 ENCOUNTER — Other Ambulatory Visit (INDEPENDENT_AMBULATORY_CARE_PROVIDER_SITE_OTHER): Payer: Medicare HMO

## 2022-04-15 ENCOUNTER — Encounter: Payer: Self-pay | Admitting: Orthopaedic Surgery

## 2022-04-15 ENCOUNTER — Ambulatory Visit: Payer: Medicare HMO | Admitting: Orthopaedic Surgery

## 2022-04-15 DIAGNOSIS — G8929 Other chronic pain: Secondary | ICD-10-CM

## 2022-04-15 DIAGNOSIS — M25562 Pain in left knee: Secondary | ICD-10-CM

## 2022-04-15 MED ORDER — LIDOCAINE HCL 1 % IJ SOLN
3.0000 mL | INTRAMUSCULAR | Status: AC | PRN
  Administered 2022-04-15: 3 mL

## 2022-04-15 MED ORDER — METHYLPREDNISOLONE ACETATE 40 MG/ML IJ SUSP
40.0000 mg | INTRAMUSCULAR | Status: AC | PRN
Start: 2022-04-15 — End: 2022-04-15
  Administered 2022-04-15: 40 mg via INTRA_ARTICULAR

## 2022-04-15 NOTE — Progress Notes (Signed)
Office Visit Note   Patient: Vincent Mckay           Date of Birth: 25-Jul-1950           MRN: [id number] Visit Date: 04/15/2022              Requested by: Biagio Borg, MD [address] [place],  [place] [address] PCP: Biagio Borg, MD   Assessment & Plan: Visit Diagnoses:  1. Chronic pain of left knee     Plan: He tolerated the aspiration and injection left knee well today.  Ace bandage was applied he will remove this before retiring to bed this evening.  He may benefit from pullover neoprene knee sleeve.  He understands wait least 3 months between injections.  Questions were encouraged and answered at length.  Follow-Up Instructions: Return if symptoms worsen or fail to improve.   Orders:  Orders Placed This Encounter  Procedures   Large Joint Inj   XR Knee 1-2 Views Left   No orders of the defined types were placed in this encounter.     Procedures: Large Joint Inj: L knee on 04/15/2022 11:53 AM Indications: pain Details: 22 G 1.5 in needle, anterolateral approach  Arthrogram: No  Medications: 3 mL lidocaine 1 %; 40 mg methylPREDNISolone acetate 40 MG/ML Aspirate: 3 mL yellow Outcome: tolerated well, no immediate complications Procedure, treatment alternatives, risks and benefits explained, specific risks discussed. Consent was given by the patient. Immediately prior to procedure a time out was called to verify the correct patient, procedure, equipment, support staff and site/side marked as required. Patient was prepped and draped in the usual sterile fashion.       Clinical Data: No additional findings.   Subjective: Chief Complaint  Patient presents with   Left Knee - Pain    HPI Vincent Mckay comes in today with left knee pain.  No known injury.  He up he has been doing a lot of work around the house including helping clean up some large trees that he had to have cut down.  He states the pain in his left knee is became worse.  He states he has had on and  off knee pain for years but the last week it is became such that it is 7-8 out of 10 pain.  He has trouble getting in and out of the vehicle.  He did denies any mechanical symptoms of the knee.  Most of his pain is medial aspect of the left knee.  Denies any right knee pain.  He has tried Advil, ibuprofen and Voltaren gel with no relief of the left knee pain. Review of Systems See HPI otherwise negative or noncontributory  Objective: Vital Signs: There were no vitals taken for this visit.  Physical Exam Constitutional:      Appearance: He is not ill-appearing or diaphoretic.  Pulmonary:     Effort: Pulmonary effort is normal.  Neurological:     Mental Status: He is alert and oriented to person, place, and time.     Ortho Exam Bilateral knees full extension full flexion.  Crepitus of both knees with passive range of motion.  No instability valgus varus stressing of either knee.  Nontender at medial lateral joint line of both knees.  No abnormal warmth erythema of either knee.  Left knee with slight effusion. Specialty Comments:  No specialty comments available.  Imaging: XR Knee 1-2 Views Left  Result Date: 04/15/2022 Left knee 3 views: Knee is  well located.  Tricompartmental arthritis with near bone-on-bone medial compartment severe patellofemoral arthritic changes with significant osteophyte.  Lateral compartment with slight narrowing.  No acute fractures or acute findings.    PMFS History: Patient Active Problem List   Diagnosis Date Noted   Skin lesion 09/25/2016   Dyspnea 03/02/2016   Hyperglycemia 09/12/2015   Corn or callus 08/19/2011   Personal history of other malignant neoplasm of skin 08/19/2011   Encounter for well adult exam with abnormal findings 05/13/2010   Chronic rhinitis 11/15/2008   Cough 11/15/2008   Allergic asthma, moderate persistent, uncomplicated 0000000   Essential hypertension 03/06/2008   Hyperlipidemia 12/01/2007   Past Medical History:   Diagnosis Date   Asthma    ASTHMA, UNSPECIFIED, UNSPECIFIED STATUS 10/05/2008   Chronic rhinitis 11/15/2008   Cough 11/15/2008   HAY FEVER 10/22/2006   Hyperglycemia 09/12/2015   HYPERLIPIDEMIA 12/01/2007   HYPERTENSION 03/06/2008   JAUNDICE 10/22/2006   SINUSITIS- ACUTE-NOS 12/01/2007    Family History  Problem Relation Age of Onset   Arthritis Other    Diabetes Other     Past Surgical History:  Procedure Laterality Date   FINGER GANGLION CYST EXCISION     right index   s/p left bicep tendon rupture  2004   TONSILLECTOMY     Social History   Occupational History   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

## 2022-04-21 ENCOUNTER — Encounter: Payer: Self-pay | Admitting: Orthopaedic Surgery

## 2022-06-23 DIAGNOSIS — D235 Other benign neoplasm of skin of trunk: Secondary | ICD-10-CM | POA: Diagnosis not present

## 2022-06-23 DIAGNOSIS — L814 Other melanin hyperpigmentation: Secondary | ICD-10-CM | POA: Diagnosis not present

## 2022-06-23 DIAGNOSIS — Z85828 Personal history of other malignant neoplasm of skin: Secondary | ICD-10-CM | POA: Diagnosis not present

## 2022-06-23 DIAGNOSIS — D225 Melanocytic nevi of trunk: Secondary | ICD-10-CM | POA: Diagnosis not present

## 2022-06-23 DIAGNOSIS — L570 Actinic keratosis: Secondary | ICD-10-CM | POA: Diagnosis not present

## 2022-06-23 DIAGNOSIS — L579 Skin changes due to chronic exposure to nonionizing radiation, unspecified: Secondary | ICD-10-CM | POA: Diagnosis not present

## 2022-06-23 DIAGNOSIS — L209 Atopic dermatitis, unspecified: Secondary | ICD-10-CM | POA: Diagnosis not present

## 2022-06-23 DIAGNOSIS — L821 Other seborrheic keratosis: Secondary | ICD-10-CM | POA: Diagnosis not present

## 2022-07-13 ENCOUNTER — Ambulatory Visit: Payer: Medicare HMO | Admitting: Internal Medicine

## 2022-08-02 NOTE — Progress Notes (Signed)
HPI male never smoker followed for Asthma, allergic rhinitis, complicated by HBP, ASCVD Office spirometry 12/25/08  ------------------------------------------------------------------------------------   07/10/21- 72 year old male never smoker followed for Asthma, allergic rhinitis, complicated by HBP,ASCVD, Hyperlipidemia,  Body weight today- 243 lbs -Ventolin HFA, Flovent 110 hfa, flonase Covid vax- 2 Moderna He finds using his Ventolin inhaler 1 puff, 1 time daily as a maintenance control works very well for him.  Does continue Flovent.  No exacerbation or significant concern.  08/04/22- 72 year old male never smoker followed for Asthma, allergic rhinitis, complicated by HBP,ASCVD, Hyperlipidemia,  Body weight today- 243 lbs -Ventolin HFA, Arnuity  flonase Pt states he is doing well We discussed medications.  He says he is able to get Flovent 110 and asks we reorder that.  I will write the prescription and we will see what he is given. No recent exacerbation.  ROS-see HPI + = positive Constitutional:   No-   weight loss, night sweats, fevers, chills, fatigue, lassitude. HEENT:   No-  headaches, difficulty swallowing, tooth/dental problems, sore throat,       No-  sneezing, itching, ear ache, nasal congestion, post nasal drip,  CV:  No-   chest pain, orthopnea, PND, swelling in lower extremities, anasarca, dizziness, palpitations Resp: No-   shortness of breath with exertion or at rest.              No-   productive cough,  No non-productive cough,  No- coughing up of blood.              No-   change in color of mucus.  No- wheezing.   Skin: No-   rash or lesions. GI:  No-   heartburn, indigestion, abdominal pain, nausea, vomiting,  GU:  MS:  No-   joint pain or swelling.  . Neuro-     nothing unusual Psych:  No- change in mood or affect. No depression or anxiety.  No memory loss.  OBJ- Physical Exam General- Alert, Oriented, Affect-appropriate, Distress- none acute,  overweight Skin- rash-none, lesions- none, excoriation- none Lymphadenopathy- none Head- atraumatic            Eyes- Gross vision intact, PERRLA, conjunctivae and secretions clear            Ears- Hearing, canals-normal            Nose- Clear, no-Septal dev, mucus, polyps, erosion, perforation             Throat- Mallampati II , mucosa clear , drainage- none, tonsils- atrophic Neck- flexible , trachea midline, no stridor , thyroid nl, carotid no bruit Chest - symmetrical excursion , unlabored           Heart/CV- RRR , no murmur , no gallop  , no rub, nl s1 s2                           - JVD- none , edema- none, stasis changes- none, varices- none           Lung- clear to P&A, wheeze- none, cough- none , dullness-none, rub- none           Chest wall-  Abd- + obese  Br/ Gen/ Rectal- Not done, not indicated Extrem- cyanosis- none, clubbing, none, atrophy- none, strength- nl Neuro- grossly intact to observation

## 2022-08-04 ENCOUNTER — Encounter: Payer: Self-pay | Admitting: Internal Medicine

## 2022-08-04 ENCOUNTER — Ambulatory Visit: Payer: Medicare HMO | Admitting: Internal Medicine

## 2022-08-04 VITALS — BP 122/60 | HR 60 | Ht 70.0 in | Wt 230.8 lb

## 2022-08-04 DIAGNOSIS — J4540 Moderate persistent asthma, uncomplicated: Secondary | ICD-10-CM | POA: Diagnosis not present

## 2022-08-04 DIAGNOSIS — J310 Chronic rhinitis: Secondary | ICD-10-CM

## 2022-08-04 MED ORDER — ALBUTEROL SULFATE HFA 108 (90 BASE) MCG/ACT IN AERS: INHALATION_SPRAY | RESPIRATORY_TRACT | 4 refills | Status: DC

## 2022-08-04 MED ORDER — FLUTICASONE PROPIONATE 50 MCG/ACT NA SUSP: NASAL | 4 refills | Status: DC

## 2022-08-04 MED ORDER — FLUTICASONE PROPIONATE HFA 110 MCG/ACT IN AERO: 2.0000 | INHALATION_SPRAY | Freq: Two times a day (BID) | RESPIRATORY_TRACT | 4 refills | Status: DC

## 2022-08-04 NOTE — Patient Instructions (Addendum)
Refills sent for albuterol hfa, flonase, fluticasone 110 hfa  Please leet [place] know if we can help

## 2022-08-14 ENCOUNTER — Other Ambulatory Visit (INDEPENDENT_AMBULATORY_CARE_PROVIDER_SITE_OTHER): Payer: Medicare HMO

## 2022-08-14 DIAGNOSIS — E538 Deficiency of other specified B group vitamins: Secondary | ICD-10-CM | POA: Diagnosis not present

## 2022-08-14 DIAGNOSIS — Z125 Encounter for screening for malignant neoplasm of prostate: Secondary | ICD-10-CM

## 2022-08-14 DIAGNOSIS — R739 Hyperglycemia, unspecified: Secondary | ICD-10-CM

## 2022-08-14 DIAGNOSIS — E559 Vitamin D deficiency, unspecified: Secondary | ICD-10-CM

## 2022-08-14 DIAGNOSIS — E7800 Pure hypercholesterolemia, unspecified: Secondary | ICD-10-CM

## 2022-08-14 LAB — TSH: TSH: 0.82 u[IU]/mL (ref 0.35–5.50)

## 2022-08-14 LAB — HEPATIC FUNCTION PANEL
ALT: 27 U/L (ref 0–53)
AST: 26 U/L (ref 0–37)
Albumin: 4.3 g/dL (ref 3.5–5.2)
Alkaline Phosphatase: 44 U/L (ref 39–117)
Bilirubin, Direct: 0.1 mg/dL (ref 0.0–0.3)
Total Bilirubin: 0.6 mg/dL (ref 0.2–1.2)
Total Protein: 6.4 g/dL (ref 6.0–8.3)

## 2022-08-14 LAB — URINALYSIS, ROUTINE W REFLEX MICROSCOPIC
Bilirubin Urine: NEGATIVE
Hgb urine dipstick: NEGATIVE
Ketones, ur: NEGATIVE
Leukocytes,Ua: NEGATIVE
Nitrite: NEGATIVE
RBC / HPF: NONE SEEN (ref 0–?)
Specific Gravity, Urine: 1.025 (ref 1.000–1.030)
Total Protein, Urine: NEGATIVE
Urine Glucose: NEGATIVE
Urobilinogen, UA: 0.2 (ref 0.0–1.0)
WBC, UA: NONE SEEN (ref 0–?)
pH: 6 (ref 5.0–8.0)

## 2022-08-14 LAB — CBC WITH DIFFERENTIAL/PLATELET
Basophils Absolute: 0 10*3/uL (ref 0.0–0.1)
Basophils Relative: 0.5 % (ref 0.0–3.0)
Eosinophils Absolute: 0.3 10*3/uL (ref 0.0–0.7)
Eosinophils Relative: 4.7 % (ref 0.0–5.0)
HCT: 39 % (ref 39.0–52.0)
Hemoglobin: 13.1 g/dL (ref 13.0–17.0)
Lymphocytes Relative: 13.3 % (ref 12.0–46.0)
Lymphs Abs: 0.8 10*3/uL (ref 0.7–4.0)
MCHC: 33.6 g/dL (ref 30.0–36.0)
MCV: 86.1 fl (ref 78.0–100.0)
Monocytes Absolute: 0.7 10*3/uL (ref 0.1–1.0)
Monocytes Relative: 11.7 % (ref 3.0–12.0)
Neutro Abs: 4.4 10*3/uL (ref 1.4–7.7)
Neutrophils Relative %: 69.8 % (ref 43.0–77.0)
Platelets: 196 10*3/uL (ref 150.0–400.0)
RBC: 4.53 Mil/uL (ref 4.22–5.81)
RDW: 13.2 % (ref 11.5–15.5)
WBC: 6.3 10*3/uL (ref 4.0–10.5)

## 2022-08-14 LAB — BASIC METABOLIC PANEL
BUN: 35 mg/dL — ABNORMAL HIGH (ref 6–23)
CO2: 25 mEq/L (ref 19–32)
Calcium: 9.3 mg/dL (ref 8.4–10.5)
Chloride: 107 mEq/L (ref 96–112)
Creatinine, Ser: 1.33 mg/dL (ref 0.40–1.50)
GFR: 53.39 mL/min — ABNORMAL LOW (ref >60.00)
Glucose, Bld: 77 mg/dL (ref 70–99)
Potassium: 4.5 mEq/L (ref 3.5–5.1)
Sodium: 140 mEq/L (ref 135–145)

## 2022-08-14 LAB — VITAMIN D 25 HYDROXY (VIT D DEFICIENCY, FRACTURES): VITD: 46.27 ng/mL (ref 30.00–100.00)

## 2022-08-14 LAB — HEMOGLOBIN A1C: Hgb A1c MFr Bld: 5.8 % (ref 4.6–6.5)

## 2022-08-14 LAB — LIPID PANEL
Cholesterol: 125 mg/dL (ref 0–200)
HDL: 36.4 mg/dL — ABNORMAL LOW (ref >39.00)
LDL Cholesterol: 71 mg/dL (ref 0–99)
NonHDL: 88.82
Total CHOL/HDL Ratio: 3
Triglycerides: 89 mg/dL (ref 0.0–149.0)
VLDL: 17.8 mg/dL (ref 0.0–40.0)

## 2022-08-14 LAB — VITAMIN B12: Vitamin B-12: 888 pg/mL (ref 211–911)

## 2022-08-14 LAB — PSA: PSA: 2.63 ng/mL (ref 0.10–4.00)

## 2022-08-21 ENCOUNTER — Ambulatory Visit: Payer: Medicare HMO | Admitting: Internal Medicine

## 2022-08-21 VITALS — BP 130/68 | HR 57 | Temp 98.7°F | Ht 70.0 in | Wt 231.6 lb

## 2022-08-21 DIAGNOSIS — E559 Vitamin D deficiency, unspecified: Secondary | ICD-10-CM | POA: Diagnosis not present

## 2022-08-21 DIAGNOSIS — Z125 Encounter for screening for malignant neoplasm of prostate: Secondary | ICD-10-CM | POA: Diagnosis not present

## 2022-08-21 DIAGNOSIS — E538 Deficiency of other specified B group vitamins: Secondary | ICD-10-CM | POA: Diagnosis not present

## 2022-08-21 DIAGNOSIS — R739 Hyperglycemia, unspecified: Secondary | ICD-10-CM | POA: Diagnosis not present

## 2022-08-21 DIAGNOSIS — E7800 Pure hypercholesterolemia, unspecified: Secondary | ICD-10-CM | POA: Diagnosis not present

## 2022-08-21 DIAGNOSIS — N1831 Chronic kidney disease, stage 3a: Secondary | ICD-10-CM | POA: Insufficient documentation

## 2022-08-21 DIAGNOSIS — I10 Essential (primary) hypertension: Secondary | ICD-10-CM | POA: Diagnosis not present

## 2022-08-21 DIAGNOSIS — Z0001 Encounter for general adult medical examination with abnormal findings: Secondary | ICD-10-CM

## 2022-08-21 MED ORDER — ATORVASTATIN CALCIUM 10 MG PO TABS: ORAL_TABLET | ORAL | 3 refills | Status: DC

## 2022-08-21 MED ORDER — OLMESARTAN MEDOXOMIL-HCTZ 20-12.5 MG PO TABS: 1.0000 | ORAL_TABLET | Freq: Every day | ORAL | 3 refills | Status: DC

## 2022-08-21 NOTE — Progress Notes (Unsigned)
Patient ID: Vincent Mckay, male   DOB: [birth year], 72 y.o.   MRN: [id number]         Chief Complaint:: wellness exam and hld, htn, hyperglycemia, ckd3a       HPI:  Vincent Mckay is a 72 y.o. male here for wellness exam; declines covid booster, declienes colonoscopy, o/w up to date                        Also Pt denies chest pain, increased sob or doe, wheezing, orthopnea, PND, increased LE swelling, palpitations, dizziness or syncope.   Pt denies polydipsia, polyuria, or new focal neuro s/s.    Pt denies fever, wt loss, night sweats, loss of appetite, or other constitutional symptoms   Did have recent left knee cortisone injeciton, now better.     Wt Readings from Last 3 Encounters:  08/21/22 231 lb 9.6 oz (105.1 kg)  08/04/22 230 lb 12.8 oz (104.7 kg)  09/26/21 242 lb (109.8 kg)   BP Readings from Last 3 Encounters:  08/21/22 130/68  08/04/22 122/60  09/26/21 (!) 148/66   Immunization History  Administered Date(s) Administered   Fluad Quad(high Dose 65+) 08/31/2018, 10/18/2019   Influenza Split 09/21/2011   Influenza Whole 11/15/2008   Influenza, High Dose Seasonal PF 10/11/2017   Influenza,inj,Quad PF,6+ Mos 09/29/2012   Moderna Sars-Covid-2 Vaccination 05/13/2019, 06/05/2019   Pneumococcal Conjugate-13 10/03/2015   Pneumococcal Polysaccharide-23 11/15/2008, 09/25/2016   Tdap 08/31/2018   Zoster Recombinant(Shingrix) 10/14/2020, 12/12/2020   Zoster, Live 09/29/2012   Health Maintenance Due  Topic Date Due   COVID-19 Vaccine (3 - 2023-24 season) 09/12/2021   Medicare Annual Wellness (AWV)  12/18/2021   INFLUENZA VACCINE  08/13/2022      Past Medical History:  Diagnosis Date   Asthma    ASTHMA, UNSPECIFIED, UNSPECIFIED STATUS 10/05/2008   Chronic rhinitis 11/15/2008   Cough 11/15/2008   HAY FEVER 10/22/2006   Hyperglycemia 09/12/2015   HYPERLIPIDEMIA 12/01/2007   HYPERTENSION 03/06/2008   JAUNDICE 10/22/2006   SINUSITIS- ACUTE-NOS 12/01/2007   Past Surgical History:   Procedure Laterality Date   FINGER GANGLION CYST EXCISION     right index   s/p left bicep tendon rupture  2004   TONSILLECTOMY      reports that he has never smoked. He has never used smokeless tobacco. He reports that he does not drink alcohol and does not use drugs. family history includes Arthritis in an other family member; Diabetes in an other family member. Allergies  Allergen Reactions   Ace Inhibitors     REACTION: Cough   Current Outpatient Medications on File Prior to Visit  Medication Sig Dispense Refill   acyclovir (ZOVIRAX) 200 MG capsule Take by mouth.     albuterol (VENTOLIN HFA) 108 (90 Base) MCG/ACT inhaler Inhale 2 puffs every 4-6 hours as needed- rescue inhaler 54 g 4   aspirin 81 MG EC tablet Take 1 tablet (81 mg total) by mouth daily. Swallow whole. 30 tablet 12   fluticasone (FLONASE) 50 MCG/ACT nasal spray 1-2 puffs each nostril once daily while needed 48 mL 4   fluticasone (FLOVENT HFA) 110 MCG/ACT inhaler Inhale 2 puffs into the lungs in the morning and at bedtime. 1 each 12   fluticasone (FLOVENT HFA) 110 MCG/ACT inhaler Inhale 2 puffs into the lungs 2 (two) times daily. *Please fill the generic version* 3 each 4   mupirocin ointment (BACTROBAN) 2 % Apply topically 2 (two) times daily.  vitamin C (ASCORBIC ACID) 500 MG tablet Take 500 mg by mouth daily.       No current facility-administered medications on file prior to visit.        ROS:  All others reviewed and negative.  Objective        PE:  BP 130/68 (BP Location: Right Arm, Patient Position: Sitting, Cuff Size: Normal)   Pulse (!) 57   Temp 98.7 F (37.1 C) (Oral)   Ht 5\' 10"  (1.778 m)   Wt 231 lb 9.6 oz (105.1 kg)   SpO2 96%   BMI 33.23 kg/m                 Constitutional: Pt appears in NAD               HENT: Head: NCAT.                Right Ear: External ear normal.                 Left Ear: External ear normal.                Eyes: . Pupils are equal, round, and reactive to light.  Conjunctivae and EOM are normal               Nose: without d/c or deformity               Neck: Neck supple. Gross normal ROM               Cardiovascular: Normal rate and regular rhythm.                 Pulmonary/Chest: Effort normal and breath sounds without rales or wheezing.                Abd:  Soft, NT, ND, + BS, no organomegaly               Neurological: Pt is alert. At baseline orientation, motor grossly intact               Skin: Skin is warm. No rashes, no other new lesions, LE edema - none               Psychiatric: Pt behavior is normal without agitation   Micro: none  Cardiac tracings I have personally interpreted today:  none  Pertinent Radiological findings (summarize): none   Lab Results  Component Value Date   WBC 6.3 08/14/2022   HGB 13.1 08/14/2022   HCT 39.0 08/14/2022   PLT 196.0 08/14/2022   GLUCOSE 77 08/14/2022   CHOL 125 08/14/2022   TRIG 89.0 08/14/2022   HDL 36.40 (L) 08/14/2022   LDLDIRECT 161.8 05/16/2010   LDLCALC 71 08/14/2022   ALT 27 08/14/2022   AST 26 08/14/2022   NA 140 08/14/2022   K 4.5 08/14/2022   CL 107 08/14/2022   CREATININE 1.33 08/14/2022   BUN 35 (H) 08/14/2022   CO2 25 08/14/2022   TSH 0.82 08/14/2022   PSA 2.63 08/14/2022   HGBA1C 5.8 08/14/2022   Assessment/Plan:  Vincent Mckay is a 72 y.o. White or Caucasian [1] male with  has a past medical history of Asthma, ASTHMA, UNSPECIFIED, UNSPECIFIED STATUS (10/05/2008), Chronic rhinitis (11/15/2008), Cough (11/15/2008), HAY FEVER (10/22/2006), Hyperglycemia (09/12/2015), HYPERLIPIDEMIA (12/01/2007), HYPERTENSION (03/06/2008), JAUNDICE (10/22/2006), and SINUSITIS- ACUTE-NOS (12/01/2007).  Encounter for well adult exam with abnormal findings Age and sex appropriate education and counseling updated with regular exercise  and diet Referrals for preventative services - declines colonsocopy for now Immunizations addressed - decliens covid booster Smoking counseling  - none needed Evidence  for depression or other mood disorder - none significant Most recent labs reviewed. I have personally reviewed and have noted: 1) the patient's medical and social history 2) The patient's current medications and supplements 3) The patient's height, weight, and BMI have been recorded in the chart   Hyperlipidemia Lab Results  Component Value Date   LDLCALC 71 08/14/2022   Uncontrolled, goal ldl < 70, pt to continue current statin lipitor 10 mg every day and lower chol diet, declines change for now   Essential hypertension BP Readings from Last 3 Encounters:  08/21/22 130/68  08/04/22 122/60  09/26/21 (!) 148/66   Stable, pt to continue medical treatment benicar hct 20-12.5 qd   Hyperglycemia Lab Results  Component Value Date   HGBA1C 5.8 08/14/2022   Stable, pt to continue current medical treatment  - diet, wt control    CKD stage 3a, GFR 45-59 ml/min (HCC) Lab Results  Component Value Date   CREATININE 1.33 08/14/2022   Stable overall, cont to avoid nephrotoxins  Followup: Return in about 1 year (around 08/21/2023).  Oliver Barre, MD 08/22/2022 9:41 PM [facility]

## 2022-08-21 NOTE — Patient Instructions (Addendum)
Please continue all other medications as before, and refills have been done if requested.  Please have the pharmacy call with any other refills you may need.  Please continue your efforts at being more active, low cholesterol diet, and weight control.  You are otherwise up to date with prevention measures today.  Please keep your appointments with your specialists as you may have planned  Please make an Appointment to return for your 1 year visit, or sooner if needed, with Lab testing by Appointment as well, to be done about 3-5 days before at the FIRST FLOOR Lab (so this is for TWO appointments - please see the scheduling desk as you leave)    

## 2022-08-22 ENCOUNTER — Encounter: Payer: Self-pay | Admitting: Internal Medicine

## 2022-08-22 NOTE — Assessment & Plan Note (Signed)
Lab Results  Component Value Date   LDLCALC 71 08/14/2022   Uncontrolled, goal ldl < 70, pt to continue current statin lipitor 10 mg every day and lower chol diet, declines change for now

## 2022-08-22 NOTE — Assessment & Plan Note (Signed)
 Lab Results  Component Value Date   HGBA1C 5.8 08/14/2022   Stable, pt to continue current medical treatment  - diet, wt control

## 2022-08-22 NOTE — Assessment & Plan Note (Signed)
Lab Results  Component Value Date   CREATININE 1.33 08/14/2022   Stable overall, cont to avoid nephrotoxins

## 2022-08-22 NOTE — Assessment & Plan Note (Signed)
Age and sex appropriate education and counseling updated with regular exercise and diet Referrals for preventative services - declines colonsocopy for now Immunizations addressed - decliens covid booster Smoking counseling  - none needed Evidence for depression or other mood disorder - none significant Most recent labs reviewed. I have personally reviewed and have noted: 1) the patient's medical and social history 2) The patient's current medications and supplements 3) The patient's height, weight, and BMI have been recorded in the chart

## 2022-08-22 NOTE — Addendum Note (Signed)
Addended by: Corwin Levins on: 08/22/2022 09:42 PM   Modules accepted: Orders

## 2022-08-22 NOTE — Assessment & Plan Note (Signed)
BP Readings from Last 3 Encounters:  08/21/22 130/68  08/04/22 122/60  09/26/21 (!) 148/66   Stable, pt to continue medical treatment benicar hct 20-12.5 qd

## 2022-08-28 ENCOUNTER — Encounter: Payer: Self-pay | Admitting: Internal Medicine

## 2022-08-28 NOTE — Assessment & Plan Note (Signed)
Uncomplicated on current medication.  He says he can get Flovent 110.  I assume it is a generic or equivalent product since my understanding is that all Flovent products have been discontinued. Plan-refill Flovent 110, albuterol, Flonase

## 2022-08-28 NOTE — Assessment & Plan Note (Signed)
He is not complaining of rhinitis symptoms currently.  We will watch as seasons progress.

## 2022-09-28 ENCOUNTER — Other Ambulatory Visit: Payer: Medicare HMO

## 2022-10-01 ENCOUNTER — Encounter: Payer: Medicare HMO | Admitting: Internal Medicine

## 2022-10-16 ENCOUNTER — Other Ambulatory Visit: Payer: Self-pay | Admitting: Internal Medicine

## 2022-10-16 DIAGNOSIS — Z1212 Encounter for screening for malignant neoplasm of rectum: Secondary | ICD-10-CM

## 2022-10-16 DIAGNOSIS — Z1211 Encounter for screening for malignant neoplasm of colon: Secondary | ICD-10-CM

## 2022-11-29 ENCOUNTER — Other Ambulatory Visit: Payer: Self-pay | Admitting: Internal Medicine

## 2022-12-11 DIAGNOSIS — R69 Illness, unspecified: Secondary | ICD-10-CM | POA: Diagnosis not present

## 2022-12-16 DIAGNOSIS — L209 Atopic dermatitis, unspecified: Secondary | ICD-10-CM | POA: Diagnosis not present

## 2022-12-16 DIAGNOSIS — L821 Other seborrheic keratosis: Secondary | ICD-10-CM | POA: Diagnosis not present

## 2022-12-16 DIAGNOSIS — D485 Neoplasm of uncertain behavior of skin: Secondary | ICD-10-CM | POA: Diagnosis not present

## 2022-12-16 DIAGNOSIS — L814 Other melanin hyperpigmentation: Secondary | ICD-10-CM | POA: Diagnosis not present

## 2022-12-16 DIAGNOSIS — L739 Follicular disorder, unspecified: Secondary | ICD-10-CM | POA: Diagnosis not present

## 2022-12-16 DIAGNOSIS — D235 Other benign neoplasm of skin of trunk: Secondary | ICD-10-CM | POA: Diagnosis not present

## 2022-12-16 DIAGNOSIS — L579 Skin changes due to chronic exposure to nonionizing radiation, unspecified: Secondary | ICD-10-CM | POA: Diagnosis not present

## 2022-12-16 DIAGNOSIS — Z85828 Personal history of other malignant neoplasm of skin: Secondary | ICD-10-CM | POA: Diagnosis not present

## 2022-12-16 DIAGNOSIS — L570 Actinic keratosis: Secondary | ICD-10-CM | POA: Diagnosis not present

## 2022-12-16 DIAGNOSIS — D225 Melanocytic nevi of trunk: Secondary | ICD-10-CM | POA: Diagnosis not present

## 2022-12-24 ENCOUNTER — Encounter: Payer: Self-pay | Admitting: Physician Assistant

## 2022-12-24 ENCOUNTER — Ambulatory Visit: Payer: Medicare HMO | Admitting: Physician Assistant

## 2022-12-24 DIAGNOSIS — M1712 Unilateral primary osteoarthritis, left knee: Secondary | ICD-10-CM

## 2022-12-24 MED ORDER — METHYLPREDNISOLONE ACETATE 40 MG/ML IJ SUSP
40.0000 mg | INTRAMUSCULAR | Status: AC | PRN
Start: 2022-12-24 — End: 2022-12-24
  Administered 2022-12-24: 40 mg via INTRA_ARTICULAR

## 2022-12-24 MED ORDER — LIDOCAINE HCL 1 % IJ SOLN
3.0000 mL | INTRAMUSCULAR | Status: AC | PRN
Start: 2022-12-24 — End: 2022-12-24
  Administered 2022-12-24: 3 mL

## 2022-12-24 NOTE — Progress Notes (Signed)
   Procedure Note  Patient: Vincent Mckay             Date of Birth: [birth year]           MRN: [id number]             Visit Date: 12/24/2022 HPI: [name] 72 year old male well-known to Dr. Raye Sorrow comes in today requesting left knee injection last injection was 04/15/2022.  States injection helped until just recently.  No new injury.  He does have tricompartmental arthritis with near bone-on-bone medial compartment and severe patellofemoral changes.  Said no fevers chills.  Nondiabetic.  Physical exam: Left knee full extension full flexion.  Patellofemoral crepitus with passive range of motion.  No abnormal warmth erythema or effusion.  Procedures: Visit Diagnoses:  1. Primary osteoarthritis of left knee     Large Joint Inj on 12/24/2022 4:16 PM Indications: pain Details: 22 G 1.5 in needle, anterolateral approach  Arthrogram: No  Medications: 3 mL lidocaine 1 %; 40 mg methylPREDNISolone acetate 40 MG/ML Outcome: tolerated well, no immediate complications Procedure, treatment alternatives, risks and benefits explained, specific risks discussed. Consent was given by the patient. Immediately prior to procedure a time out was called to verify the correct patient, procedure, equipment, support staff and site/side marked as required. Patient was prepped and draped in the usual sterile fashion.     Plan: He will follow-up with [place] as needed he is to wait at least 3 months between cortisone injections.  Questions were encouraged and answered

## 2022-12-30 DIAGNOSIS — H2513 Age-related nuclear cataract, bilateral: Secondary | ICD-10-CM | POA: Diagnosis not present

## 2022-12-31 DIAGNOSIS — H524 Presbyopia: Secondary | ICD-10-CM | POA: Diagnosis not present

## 2022-12-31 DIAGNOSIS — H52223 Regular astigmatism, bilateral: Secondary | ICD-10-CM | POA: Diagnosis not present

## 2023-01-26 ENCOUNTER — Encounter: Payer: Self-pay | Admitting: Internal Medicine

## 2023-01-29 ENCOUNTER — Ambulatory Visit: Payer: PPO

## 2023-02-26 ENCOUNTER — Ambulatory Visit: Payer: PPO

## 2023-08-04 NOTE — Progress Notes (Unsigned)
 HPI male never smoker followed for Asthma, allergic rhinitis, complicated by HBP, ASCVD Office spirometry 12/25/08  ------------------------------------------------------------------------------------   08/04/22- 73 year old male never smoker followed for Asthma, allergic rhinitis, complicated by HBP,ASCVD, Hyperlipidemia,  Body weight today- 243 lbs -Ventolin  HFA, Arnuity  flonase  Pt states he is doing well We discussed medications.  He says he is able to get Flovent  110 and asks we reorder that.  I will write the prescription and we will see what he is given. No recent exacerbation.  08/05/23- 73 year old male never smoker followed for Asthma, allergic rhinitis, complicated by HBP,ASCVD, Hyperlipidemia,  Body weight today- 243 lbs -Ventolin  HFA, Flovent110  flonase  ACT-22 Discussed the use of AI scribe software for clinical note transcription with the patient, who gave verbal consent to proceed.  History of Present Illness   Vincent Mckay is a 73 year old male with asthma who presents for medication refills and management of his respiratory condition.  He experiences mild breathing difficulties, doesn't feel bothered  by current humid and rainy weather. Flovent  effectively manages his symptoms, and discontinuation leads to coughing within a week. Arnuity was ineffective. He currently uses Flovent , Flonase  nasal spray, and an albuterol  rescue inhaler. He prefers a 90-day supply from [facility] pharmacy and previously had issues obtaining Flovent , which are now resolved. He emphasizes the importance of these medications for his respiratory health.     Assessment and Plan:    Asthma moderate persistent uncomplicated Asthma well-controlled with Flovent . No exacerbations despite outdoor exposure. - Refill Flovent  inhaler. - Refill albuterol  rescue inhaler. - Refill Flonase  nasal spray.  Hypertension Blood pressure improved, possibly due to weight loss.     ROS-see HPI + =  positive Constitutional:   No-   weight loss, night sweats, fevers, chills, fatigue, lassitude. HEENT:   No-  headaches, difficulty swallowing, tooth/dental problems, sore throat,       No-  sneezing, itching, ear ache, nasal congestion, post nasal drip,  CV:  No-   chest pain, orthopnea, PND, swelling in lower extremities, anasarca, dizziness, palpitations Resp: No-   shortness of breath with exertion or at rest.              No-   productive cough,  No non-productive cough,  No- coughing up of blood.              No-   change in color of mucus.  No- wheezing.   Skin: No-   rash or lesions. GI:  No-   heartburn, indigestion, abdominal pain, nausea, vomiting,  GU:  MS:  No-   joint pain or swelling.  . Neuro-     nothing unusual Psych:  No- change in mood or affect. No depression or anxiety.  No memory loss.  OBJ- Physical Exam General- Alert, Oriented, Affect-appropriate, Distress- none acute, overweight Skin- rash-none, lesions- none, excoriation- none Lymphadenopathy- none Head- atraumatic            Eyes- Gross vision intact, PERRLA, conjunctivae and secretions clear            Ears- Hearing, canals-normal            Nose- Clear, no-Septal dev, mucus, polyps, erosion, perforation             Throat- Mallampati II , mucosa clear , drainage- none, tonsils- atrophic Neck- flexible , trachea midline, no stridor , thyroid  nl, carotid no bruit Chest - symmetrical excursion , unlabored  Heart/CV- RRR , no murmur , no gallop  , no rub, nl s1 s2                           - JVD- none , edema- none, stasis changes- none, varices- none           Lung- clear to P&A, wheeze- none, cough- none , dullness-none, rub- none           Chest wall-  Abd- + obese  Br/ Gen/ Rectal- Not done, not indicated Extrem- cyanosis- none, clubbing, none, atrophy- none, strength- nl Neuro- grossly intact to observation

## 2023-08-05 ENCOUNTER — Ambulatory Visit: Payer: Medicare HMO | Admitting: Internal Medicine

## 2023-08-05 ENCOUNTER — Encounter: Payer: Self-pay | Admitting: Internal Medicine

## 2023-08-05 VITALS — BP 134/60 | HR 73 | Temp 97.7°F | Ht 70.0 in | Wt 227.0 lb

## 2023-08-05 DIAGNOSIS — J4540 Moderate persistent asthma, uncomplicated: Secondary | ICD-10-CM | POA: Diagnosis not present

## 2023-08-05 MED ORDER — FLUTICASONE PROPIONATE 50 MCG/ACT NA SUSP: NASAL | 4 refills | Status: AC

## 2023-08-05 MED ORDER — ALBUTEROL SULFATE HFA 108 (90 BASE) MCG/ACT IN AERS: INHALATION_SPRAY | RESPIRATORY_TRACT | 4 refills | Status: AC

## 2023-08-05 MED ORDER — FLUTICASONE PROPIONATE HFA 110 MCG/ACT IN AERO: 2.0000 | INHALATION_SPRAY | Freq: Two times a day (BID) | RESPIRATORY_TRACT | 4 refills | Status: AC

## 2023-08-05 NOTE — Patient Instructions (Signed)
Meds refilled  Glad you are doing well. Please call if we can help

## 2023-08-06 ENCOUNTER — Telehealth: Payer: Self-pay

## 2023-08-06 ENCOUNTER — Other Ambulatory Visit (HOSPITAL_COMMUNITY): Payer: Self-pay

## 2023-08-06 NOTE — Telephone Encounter (Signed)
*  Pulm  Per test claims through [facility] these are the following covered alternatives and their prices at this time:   Asmanex  Twisthaler: $100.00 Asmanex  HFA: $96.48 Qvar  Redihaler: $47.00 Arnuity: $47.00

## 2023-08-06 NOTE — Telephone Encounter (Signed)
 Copied from CRM [id number]. Topic: Clinical - Prescription Issue >> Aug 06, 2023 11:06 AM Rozanna MATSU wrote: Reason for CRM: [place] WITH [facility] STATED THIS MED fluticasone  (FLOVENT  HFA) 110 MCG/ACT inhaler [[id number]] THE INSURANCE IS NOT COVERING. THEY WILL EITHER NEED A NEW PRESCRIPTTION FOR ANOTHER MED OR PRIOR AUTHORIZATION. THANKS  Can you please advise of formulary alternatives for Flovent  inhaler.

## 2023-08-10 NOTE — Telephone Encounter (Signed)
 Thanks for doing the test claims- big help!! Can you please send Arnutiy 100, # 1, ref x 5    Inhale 1 puff then rinse mouth, once daily.

## 2023-08-10 NOTE — Telephone Encounter (Signed)
 Dr. Neysa please advise alternatives for Flovent  inhaler.

## 2023-08-11 NOTE — Telephone Encounter (Signed)
 I called the pt and there was no answer- LMTCB and will wait until we speak with him before sending rx-need to verify his pharmacy.

## 2023-08-12 ENCOUNTER — Telehealth: Payer: Self-pay

## 2023-08-12 NOTE — Telephone Encounter (Signed)
 Copied from CRM [id number]. Topic: Clinical - Prescription Issue >> Aug 11, 2023  1:34 PM Celestine FALCON wrote: Reason for CRM: Pt missed a call from [place] regarding verification of his preferred pharmacy. Pt stated he prefers to use [facility] [address] - [place], [place] - [address] [place] AT [facility] [address] for his medication prescriptions.   Pt stated he was already receiving albuterol  (VENTOLIN  HFA) 108 (90 Base) MCG/ACT inhaler [[id number]],  fluticasone  (FLONASE ) 50 MCG/ACT nasal spray [[id number]] in addition to the fluctiasone (flovent  hfa) alternative Arnuity 100. Pt stated to not fill the medication called Arnuity 100 as he has tried that before and it didn't work for him. He said he has enough of the fluctiasone (flovent  hfa) to last for the next year.   Pt stated would reach out to his insurance rep to see about getting the fluctiasone (flovent  hfa) covered.   Pt's phone number is [phone] ok to leave a vm.

## 2023-08-12 NOTE — Telephone Encounter (Addendum)
 This is not related to spec pharmacy team. If he has enough Flovent  to get through next year nothing is needed from us . Looks like info was for [employer] to [place] for any follow-up (if needed at all)

## 2023-08-13 NOTE — Telephone Encounter (Signed)
Routing to the PA team

## 2023-08-13 NOTE — Telephone Encounter (Signed)
 Please see if [employer] team can get him Formulary exception to get Flovent  110 as previously prescribed and as he explains in his message, instead of Arnuity.

## 2023-08-13 NOTE — Telephone Encounter (Signed)
 I called and spoke to Vincent Mckay. Vincent Mckay informed of Dr Saundra note and states he does not need to take Arnuity as this gave him a cough for 3 months. Vincent Mckay states he knows what works for him and he needs a Formulary exception and he will pay what he needs to so he can get the RX that helps him best. Please advise, Dr Neysa.

## 2023-08-16 ENCOUNTER — Telehealth: Payer: Self-pay

## 2023-08-16 ENCOUNTER — Other Ambulatory Visit (HOSPITAL_COMMUNITY): Payer: Self-pay

## 2023-08-16 NOTE — Telephone Encounter (Signed)
*  Pulm  Pharmacy Patient Advocate Encounter   Received notification from Pt Calls Messages that prior authorization for Flovent  HFA 110MCG/ACT aerosol  is required/requested.   Insurance verification completed.   The patient is insured through [facility] ADVANTAGE/RX ADVANCE .   Per test claim: PA required; PA submitted to above mentioned insurance via CoverMyMeds Key/confirmation #/EOC [facility] Status is pending

## 2023-08-17 ENCOUNTER — Other Ambulatory Visit (HOSPITAL_COMMUNITY): Payer: Self-pay

## 2023-08-17 NOTE — Telephone Encounter (Signed)
 Trying to resend under specifically the generic Flovent  due to the Brand denying because of the manufacture being excluded from coverage.

## 2023-08-17 NOTE — Telephone Encounter (Signed)
 Pharmacy Patient Advocate Encounter   Received notification from Pt Calls Messages that prior authorization for Fluticasone  Propionate HFA 110MCG/ACT aerosol  is required/requested.   Insurance verification completed.   The patient is insured through [facility] ADVANTAGE/RX ADVANCE .   Per test claim: PA required; PA submitted to above mentioned insurance via CoverMyMeds Key/confirmation #/EOC [id number] Status is pending

## 2023-08-18 ENCOUNTER — Other Ambulatory Visit (HOSPITAL_COMMUNITY): Payer: Self-pay

## 2023-08-18 ENCOUNTER — Other Ambulatory Visit: Payer: Self-pay | Admitting: Internal Medicine

## 2023-08-18 NOTE — Telephone Encounter (Signed)
 Pharmacy Patient Advocate Encounter  Received notification from [facility] ADVANTAGE/RX ADVANCE that Prior Authorization for Fluticasone  Propionate HFA 110MCG/ACT aerosol  has been APPROVED from 08/17/2023 to 01/12/2024. Ran test claim, Copay is $250.00/3 months. This test claim was processed through [facility]- copay amounts may vary at other pharmacies due to pharmacy/plan contracts, or as the patient moves through the different stages of their insurance plan.

## 2023-08-19 NOTE — Telephone Encounter (Signed)
 Already handled per other telephone encounter. NFN

## 2023-08-19 NOTE — Telephone Encounter (Signed)
 I called and spoke to pt. Pt informed of the note and verbalized understanding. NFN

## 2023-08-20 ENCOUNTER — Other Ambulatory Visit: Payer: Self-pay | Admitting: Internal Medicine

## 2023-08-27 ENCOUNTER — Ambulatory Visit (INDEPENDENT_AMBULATORY_CARE_PROVIDER_SITE_OTHER): Payer: Medicare HMO | Admitting: Internal Medicine

## 2023-08-27 ENCOUNTER — Encounter: Payer: Self-pay | Admitting: Internal Medicine

## 2023-08-27 ENCOUNTER — Ambulatory Visit: Payer: Self-pay | Admitting: Internal Medicine

## 2023-08-27 VITALS — BP 126/70 | HR 62 | Temp 98.3°F | Ht 70.0 in | Wt 226.4 lb

## 2023-08-27 DIAGNOSIS — I10 Essential (primary) hypertension: Secondary | ICD-10-CM | POA: Diagnosis not present

## 2023-08-27 DIAGNOSIS — E538 Deficiency of other specified B group vitamins: Secondary | ICD-10-CM | POA: Diagnosis not present

## 2023-08-27 DIAGNOSIS — J4540 Moderate persistent asthma, uncomplicated: Secondary | ICD-10-CM | POA: Diagnosis not present

## 2023-08-27 DIAGNOSIS — E7800 Pure hypercholesterolemia, unspecified: Secondary | ICD-10-CM

## 2023-08-27 DIAGNOSIS — R739 Hyperglycemia, unspecified: Secondary | ICD-10-CM | POA: Diagnosis not present

## 2023-08-27 DIAGNOSIS — Z0000 Encounter for general adult medical examination without abnormal findings: Secondary | ICD-10-CM

## 2023-08-27 DIAGNOSIS — Z125 Encounter for screening for malignant neoplasm of prostate: Secondary | ICD-10-CM | POA: Diagnosis not present

## 2023-08-27 DIAGNOSIS — E559 Vitamin D deficiency, unspecified: Secondary | ICD-10-CM

## 2023-08-27 DIAGNOSIS — N1831 Chronic kidney disease, stage 3a: Secondary | ICD-10-CM

## 2023-08-27 DIAGNOSIS — Z0001 Encounter for general adult medical examination with abnormal findings: Secondary | ICD-10-CM

## 2023-08-27 DIAGNOSIS — Z1211 Encounter for screening for malignant neoplasm of colon: Secondary | ICD-10-CM

## 2023-08-27 LAB — CBC WITH DIFFERENTIAL/PLATELET
Basophils Absolute: 0.1 K/uL (ref 0.0–0.1)
Basophils Relative: 0.6 % (ref 0.0–3.0)
Eosinophils Absolute: 0.4 K/uL (ref 0.0–0.7)
Eosinophils Relative: 3.4 % (ref 0.0–5.0)
HCT: 40 % (ref 39.0–52.0)
Hemoglobin: 13.4 g/dL (ref 13.0–17.0)
Lymphocytes Relative: 11.1 % — ABNORMAL LOW (ref 12.0–46.0)
Lymphs Abs: 1.2 K/uL (ref 0.7–4.0)
MCHC: 33.7 g/dL (ref 30.0–36.0)
MCV: 85.5 fl (ref 78.0–100.0)
Monocytes Absolute: 1.2 K/uL — ABNORMAL HIGH (ref 0.1–1.0)
Monocytes Relative: 10.8 % (ref 3.0–12.0)
Neutro Abs: 8.3 K/uL — ABNORMAL HIGH (ref 1.4–7.7)
Neutrophils Relative %: 74.1 % (ref 43.0–77.0)
Platelets: 210 K/uL (ref 150.0–400.0)
RBC: 4.67 Mil/uL (ref 4.22–5.81)
RDW: 13.1 % (ref 11.5–15.5)
WBC: 11.1 K/uL — ABNORMAL HIGH (ref 4.0–10.5)

## 2023-08-27 LAB — MICROALBUMIN / CREATININE URINE RATIO
Creatinine,U: 112.9 mg/dL
Microalb Creat Ratio: UNDETERMINED mg/g (ref 0.0–30.0)
Microalb, Ur: <0.7 mg/dL

## 2023-08-27 LAB — URINALYSIS, ROUTINE W REFLEX MICROSCOPIC
Bilirubin Urine: NEGATIVE
Hgb urine dipstick: NEGATIVE
Ketones, ur: NEGATIVE
Leukocytes,Ua: NEGATIVE
Nitrite: NEGATIVE
RBC / HPF: NONE SEEN (ref 0–?)
Specific Gravity, Urine: 1.015 (ref 1.000–1.030)
Total Protein, Urine: NEGATIVE
Urine Glucose: NEGATIVE
Urobilinogen, UA: 0.2 (ref 0.0–1.0)
pH: 6 (ref 5.0–8.0)

## 2023-08-27 LAB — LIPID PANEL
Cholesterol: 130 mg/dL (ref 0–200)
HDL: 36.6 mg/dL — ABNORMAL LOW (ref >39.00)
LDL Cholesterol: 69 mg/dL (ref 0–99)
NonHDL: 93.78
Total CHOL/HDL Ratio: 4
Triglycerides: 126 mg/dL (ref 0.0–149.0)
VLDL: 25.2 mg/dL (ref 0.0–40.0)

## 2023-08-27 LAB — VITAMIN D 25 HYDROXY (VIT D DEFICIENCY, FRACTURES): VITD: 42.52 ng/mL (ref 30.00–100.00)

## 2023-08-27 LAB — PSA: PSA: 3.14 ng/mL (ref 0.10–4.00)

## 2023-08-27 LAB — HEPATIC FUNCTION PANEL
ALT: 33 U/L (ref 0–53)
AST: 31 U/L (ref 0–37)
Albumin: 4.5 g/dL (ref 3.5–5.2)
Alkaline Phosphatase: 50 U/L (ref 39–117)
Bilirubin, Direct: 0.2 mg/dL (ref 0.0–0.3)
Total Bilirubin: 0.6 mg/dL (ref 0.2–1.2)
Total Protein: 6.8 g/dL (ref 6.0–8.3)

## 2023-08-27 LAB — BASIC METABOLIC PANEL WITH GFR
BUN: 32 mg/dL — ABNORMAL HIGH (ref 6–23)
CO2: 26 meq/L (ref 19–32)
Calcium: 9.4 mg/dL (ref 8.4–10.5)
Chloride: 104 meq/L (ref 96–112)
Creatinine, Ser: 1.42 mg/dL (ref 0.40–1.50)
GFR: 49 mL/min — ABNORMAL LOW (ref >60.00)
Glucose, Bld: 90 mg/dL (ref 70–99)
Potassium: 4.5 meq/L (ref 3.5–5.1)
Sodium: 140 meq/L (ref 135–145)

## 2023-08-27 LAB — TSH: TSH: 1.84 u[IU]/mL (ref 0.35–5.50)

## 2023-08-27 LAB — HEMOGLOBIN A1C: Hgb A1c MFr Bld: 6.1 % (ref 4.6–6.5)

## 2023-08-27 LAB — VITAMIN B12: Vitamin B-12: 404 pg/mL (ref 211–911)

## 2023-08-27 MED ORDER — ATORVASTATIN CALCIUM 10 MG PO TABS: ORAL_TABLET | ORAL | 3 refills | Status: AC

## 2023-08-27 MED ORDER — OLMESARTAN MEDOXOMIL-HCTZ 20-12.5 MG PO TABS: 1.0000 | ORAL_TABLET | Freq: Every day | ORAL | 3 refills | Status: AC

## 2023-08-27 NOTE — Patient Instructions (Signed)
 Please continue all other medications as before, and refills have been done if requested.  Please have the pharmacy call with any other refills you may need.  Please continue your efforts at being more active, low cholesterol diet, and weight control.  You are otherwise up to date with prevention measures today.  Please keep your appointments with your specialists as you may have planned  You will be contacted regarding the referral for: colonoscopy, and pulmonary  Please go to the LAB at the blood drawing area for the tests to be done  You will be contacted by phone if any changes need to be made immediately.  Otherwise, you will receive a letter about your results with an explanation, but please check with MyChart first.  Please make an Appointment to return in 6 months, or sooner if needed

## 2023-08-27 NOTE — Progress Notes (Signed)
 Patient ID: Vincent Mckay, male   DOB: [birth year], 73 y.o.   MRN: [id number]         Chief Complaint:: wellness exam and asthma, hld, hyperglycemia, htn, ckd3a       HPI:  Vincent Mckay is a 73 y.o. male here for wellness exam; due for colonoscopy, for flu shot at pharmacy, o/w up to date                Also s/p efudex tx yesterday per dermatology with large areas bilateral facies erythema with discomfort.  Pt denies chest pain, increased sob or doe, wheezing, orthopnea, PND, increased LE swelling, palpitations, dizziness or syncope.   Pt denies polydipsia, polyuria, or new focal neuro s/s.    Pt denies fever, wt loss, night sweats, loss of appetite, or other constitutional symptoms  Needs new referral to pulmonary as Dr Neysa lucky soon.     Wt Readings from Last 3 Encounters:  08/27/23 226 lb 6.4 oz (102.7 kg)  08/05/23 227 lb (103 kg)  08/21/22 231 lb 9.6 oz (105.1 kg)   BP Readings from Last 3 Encounters:  08/27/23 126/70  08/05/23 134/60  08/21/22 130/68   Immunization History  Administered Date(s) Administered   Fluad Quad(high Dose 65+) 08/31/2018, 10/18/2019   Influenza Split 09/21/2011   Influenza Whole 11/15/2008   Influenza, High Dose Seasonal PF 10/11/2017   Influenza,inj,Quad PF,6+ Mos 09/29/2012   Moderna Sars-Covid-2 Vaccination 05/13/2019, 06/05/2019   Pneumococcal Conjugate-13 10/03/2015   Pneumococcal Polysaccharide-23 11/15/2008, 09/25/2016   Tdap 08/31/2018   Zoster Recombinant(Shingrix) 10/14/2020, 12/12/2020   Zoster, Live 09/29/2012   Health Maintenance Due  Topic Date Due   Fecal DNA (Cologuard)  Never done   Medicare Annual Wellness (AWV)  12/18/2021   INFLUENZA VACCINE  08/13/2023      Past Medical History:  Diagnosis Date   Asthma    ASTHMA, UNSPECIFIED, UNSPECIFIED STATUS 10/05/2008   Chronic rhinitis 11/15/2008   Cough 11/15/2008   HAY FEVER 10/22/2006   Hyperglycemia 09/12/2015   HYPERLIPIDEMIA 12/01/2007   HYPERTENSION 03/06/2008   JAUNDICE  10/22/2006   SINUSITIS- ACUTE-NOS 12/01/2007   Past Surgical History:  Procedure Laterality Date   FINGER GANGLION CYST EXCISION     right index   s/p left bicep tendon rupture  2004   TONSILLECTOMY      reports that he has never smoked. He has never used smokeless tobacco. He reports that he does not drink alcohol and does not use drugs. family history includes Arthritis in an other family member; Diabetes in an other family member. Allergies  Allergen Reactions   Ace Inhibitors     REACTION: Cough   Current Outpatient Medications on File Prior to Visit  Medication Sig Dispense Refill   acyclovir (ZOVIRAX) 200 MG capsule Take by mouth.     albuterol  (VENTOLIN  HFA) 108 (90 Base) MCG/ACT inhaler Inhale 2 puffs every 4-6 hours as needed- rescue inhaler 54 g 4   aspirin  81 MG EC tablet Take 1 tablet (81 mg total) by mouth daily. Swallow whole. 30 tablet 12   betamethasone dipropionate 0.05 % cream Apply topically 2 (two) times daily.     fluticasone  (FLONASE ) 50 MCG/ACT nasal spray 1-2 puffs each nostril once daily while needed 48 mL 4   fluticasone  (FLOVENT  HFA) 110 MCG/ACT inhaler Inhale 2 puffs into the lungs 2 (two) times daily. *Please fill the generic version* 3 each 4   Ivermectin (SOOLANTRA) 1 % CREA Apply 1 Application topically.  mupirocin ointment (BACTROBAN) 2 % Apply topically 2 (two) times daily.     vitamin C (ASCORBIC ACID) 500 MG tablet Take 500 mg by mouth daily.       No current facility-administered medications on file prior to visit.        ROS:  All others reviewed and negative.  Objective        PE:  BP 126/70   Pulse 62   Temp 98.3 F (36.8 C)   Ht 5' 10 (1.778 m)   Wt 226 lb 6.4 oz (102.7 kg)   SpO2 98%   BMI 32.49 kg/m                 Constitutional: Pt appears in NAD               HENT: Head: NCAT.                Right Ear: External ear normal.                 Left Ear: External ear normal.                Eyes: . Pupils are equal, round,  and reactive to light. Conjunctivae and EOM are normal               Nose: without d/c or deformity               Neck: Neck supple. Gross normal ROM               Cardiovascular: Normal rate and regular rhythm.                 Pulmonary/Chest: Effort normal and breath sounds without rales or wheezing.                Abd:  Soft, NT, ND, + BS, no organomegaly               Neurological: Pt is alert. At baseline orientation, motor grossly intact               Skin: Skin is warm. No rashes, no other new lesions, LE edema - none               Psychiatric: Pt behavior is normal without agitation   Micro: none  Cardiac tracings I have personally interpreted today:  none  Pertinent Radiological findings (summarize): none   Lab Results  Component Value Date   WBC 11.1 (H) 08/27/2023   HGB 13.4 08/27/2023   HCT 40.0 08/27/2023   PLT 210.0 08/27/2023   GLUCOSE 90 08/27/2023   CHOL 130 08/27/2023   TRIG 126.0 08/27/2023   HDL 36.60 (L) 08/27/2023   LDLDIRECT 161.8 05/16/2010   LDLCALC 69 08/27/2023   ALT 33 08/27/2023   AST 31 08/27/2023   NA 140 08/27/2023   K 4.5 08/27/2023   CL 104 08/27/2023   CREATININE 1.42 08/27/2023   BUN 32 (H) 08/27/2023   CO2 26 08/27/2023   TSH 1.84 08/27/2023   PSA 3.14 08/27/2023   HGBA1C 6.1 08/27/2023   MICROALBUR <0.7 08/27/2023   Assessment/Plan:  Vincent Mckay is a 73 y.o. White or Caucasian [1] male with  has a past medical history of Asthma, ASTHMA, UNSPECIFIED, UNSPECIFIED STATUS (10/05/2008), Chronic rhinitis (11/15/2008), Cough (11/15/2008), HAY FEVER (10/22/2006), Hyperglycemia (09/12/2015), HYPERLIPIDEMIA (12/01/2007), HYPERTENSION (03/06/2008), JAUNDICE (10/22/2006), and SINUSITIS- ACUTE-NOS (12/01/2007).  Encounter for well adult exam with abnormal findings Age and  sex appropriate education and counseling updated with regular exercise and diet Referrals for preventative services - for colonoscopy Immunizations addressed - for flu shot at  pharmacy Smoking counseling  - none needed Evidence for depression or other mood disorder - none significant Most recent labs reviewed. I have personally reviewed and have noted: 1) the patient's medical and social history 2) The patient's current medications and supplements 3) The patient's height, weight, and BMI have been recorded in the chart   Hyperlipidemia Lab Results  Component Value Date   LDLCALC 69 08/27/2023   Stable, pt to continue current statin liptior 10 mg qd   Hyperglycemia Lab Results  Component Value Date   HGBA1C 6.1 08/27/2023   Stable, pt to continue current medical treatment  - diet, wt control   Essential hypertension BP Readings from Last 3 Encounters:  08/27/23 126/70  08/05/23 134/60  08/21/22 130/68   Stable, pt to continue medical treatment benicar  hct 20 12.5 qd   CKD stage 3a, GFR 45-59 ml/min (HCC) Lab Results  Component Value Date   CREATININE 1.42 08/27/2023   Stable overall, cont to avoid nephrotoxins   Allergic asthma, moderate persistent, uncomplicated Overall stable, cont inhaler, pt requests new referral to pulmonary  Followup: Return in about 6 months (around 02/27/2024).  Vincent Rush, MD 08/28/2023 1:24 PM [facility]  Primary Care - [facility]

## 2023-08-28 ENCOUNTER — Encounter: Payer: Self-pay | Admitting: Internal Medicine

## 2023-08-28 NOTE — Assessment & Plan Note (Signed)
 BP Readings from Last 3 Encounters:  08/27/23 126/70  08/05/23 134/60  08/21/22 130/68   Stable, pt to continue medical treatment benicar  hct 20 12.5 qd

## 2023-08-28 NOTE — Assessment & Plan Note (Signed)
 Overall stable, cont inhaler, pt requests new referral to pulmonary

## 2023-08-28 NOTE — Assessment & Plan Note (Signed)
 Lab Results  Component Value Date   CREATININE 1.42 08/27/2023   Stable overall, cont to avoid nephrotoxins

## 2023-08-28 NOTE — Assessment & Plan Note (Signed)
 Lab Results  Component Value Date   HGBA1C 6.1 08/27/2023   Stable, pt to continue current medical treatment  - diet, wt control

## 2023-08-28 NOTE — Assessment & Plan Note (Signed)
 Lab Results  Component Value Date   LDLCALC 69 08/27/2023   Stable, pt to continue current statin liptior 10 mg qd

## 2023-08-28 NOTE — Assessment & Plan Note (Signed)
 Age and sex appropriate education and counseling updated with regular exercise and diet Referrals for preventative services - for colonoscopy Immunizations addressed - for flu shot at pharmacy Smoking counseling  - none needed Evidence for depression or other mood disorder - none significant Most recent labs reviewed. I have personally reviewed and have noted: 1) the patient's medical and social history 2) The patient's current medications and supplements 3) The patient's height, weight, and BMI have been recorded in the chart

## 2023-11-15 ENCOUNTER — Encounter: Payer: Self-pay | Admitting: Radiology

## 2023-11-24 ENCOUNTER — Encounter: Payer: Self-pay | Admitting: Internal Medicine

## 2023-12-03 ENCOUNTER — Telehealth: Payer: Self-pay

## 2023-12-03 ENCOUNTER — Ambulatory Visit (INDEPENDENT_AMBULATORY_CARE_PROVIDER_SITE_OTHER)

## 2023-12-03 DIAGNOSIS — Z23 Encounter for immunization: Secondary | ICD-10-CM | POA: Diagnosis not present

## 2023-12-03 NOTE — Telephone Encounter (Signed)
 Good morning!  Patient visits today post final visit with Dr.John. They are looking for a new provider and he had recommended you for them. Would you be willing to take patient and their spouse in under your care?

## 2023-12-03 NOTE — Progress Notes (Signed)
 Pt was given High dose flu vaccine with no complications.

## 2023-12-06 NOTE — Telephone Encounter (Signed)
Unfortunately, I'm not able to accept any more new patients at this time.  I'm sorry! Thank you!  

## 2023-12-07 NOTE — Telephone Encounter (Signed)
 Called and spoke with patient, informing him of Dr.Plotnikov's response
